# Patient Record
Sex: Female | Born: 1970 | Race: Black or African American | Hispanic: No | Marital: Single | Smoking: Never smoker
Health system: Southern US, Community
[De-identification: ages and names within clinical notes are randomized; demographics above are authoritative.]

## PROBLEM LIST (undated history)

## (undated) ENCOUNTER — Inpatient Hospital Stay (HOSPITAL_COMMUNITY): Payer: Self-pay

## (undated) DIAGNOSIS — F419 Anxiety disorder, unspecified: Secondary | ICD-10-CM

## (undated) DIAGNOSIS — O24419 Gestational diabetes mellitus in pregnancy, unspecified control: Secondary | ICD-10-CM

## (undated) DIAGNOSIS — R011 Cardiac murmur, unspecified: Secondary | ICD-10-CM

## (undated) HISTORY — PX: BREAST REDUCTION SURGERY: SHX8

## (undated) HISTORY — DX: Cardiac murmur, unspecified: R01.1

## (undated) HISTORY — DX: Anxiety disorder, unspecified: F41.9

---

## 1996-04-01 HISTORY — PX: SALPINGECTOMY: SHX328

## 1997-05-06 ENCOUNTER — Ambulatory Visit (HOSPITAL_COMMUNITY): Admission: RE | Admit: 1997-05-06 | Discharge: 1997-05-06 | Payer: Self-pay | Admitting: Family Medicine

## 1998-03-03 ENCOUNTER — Emergency Department (HOSPITAL_COMMUNITY): Admission: EM | Admit: 1998-03-03 | Discharge: 1998-03-03 | Payer: Self-pay | Admitting: Emergency Medicine

## 1998-03-04 ENCOUNTER — Encounter: Payer: Self-pay | Admitting: Emergency Medicine

## 1998-03-04 ENCOUNTER — Emergency Department (HOSPITAL_COMMUNITY): Admission: EM | Admit: 1998-03-04 | Discharge: 1998-03-04 | Payer: Self-pay | Admitting: Emergency Medicine

## 1998-10-06 ENCOUNTER — Inpatient Hospital Stay (HOSPITAL_COMMUNITY): Admission: AD | Admit: 1998-10-06 | Discharge: 1998-10-06 | Payer: Self-pay | Admitting: Obstetrics and Gynecology

## 1998-10-16 ENCOUNTER — Inpatient Hospital Stay (HOSPITAL_COMMUNITY): Admission: AD | Admit: 1998-10-16 | Discharge: 1998-10-19 | Payer: Self-pay | Admitting: *Deleted

## 1998-10-20 ENCOUNTER — Encounter (HOSPITAL_COMMUNITY): Admission: RE | Admit: 1998-10-20 | Discharge: 1999-01-18 | Payer: Self-pay | Admitting: *Deleted

## 1998-11-27 ENCOUNTER — Other Ambulatory Visit: Admission: RE | Admit: 1998-11-27 | Discharge: 1998-11-27 | Payer: Self-pay | Admitting: *Deleted

## 1999-01-21 ENCOUNTER — Encounter (HOSPITAL_COMMUNITY): Admission: RE | Admit: 1999-01-21 | Discharge: 1999-04-21 | Payer: Self-pay | Admitting: *Deleted

## 1999-04-24 ENCOUNTER — Encounter: Admission: RE | Admit: 1999-04-24 | Discharge: 1999-07-23 | Payer: Self-pay | Admitting: *Deleted

## 1999-08-22 ENCOUNTER — Encounter: Admission: RE | Admit: 1999-08-22 | Discharge: 1999-09-13 | Payer: Self-pay | Admitting: *Deleted

## 2000-01-04 ENCOUNTER — Other Ambulatory Visit: Admission: RE | Admit: 2000-01-04 | Discharge: 2000-01-04 | Payer: Self-pay | Admitting: Obstetrics and Gynecology

## 2000-01-24 ENCOUNTER — Inpatient Hospital Stay (HOSPITAL_COMMUNITY): Admission: AD | Admit: 2000-01-24 | Discharge: 2000-01-24 | Payer: Self-pay | Admitting: *Deleted

## 2000-06-04 ENCOUNTER — Inpatient Hospital Stay (HOSPITAL_COMMUNITY): Admission: AD | Admit: 2000-06-04 | Discharge: 2000-06-04 | Payer: Self-pay | Admitting: Obstetrics and Gynecology

## 2000-07-03 ENCOUNTER — Inpatient Hospital Stay (HOSPITAL_COMMUNITY): Admission: AD | Admit: 2000-07-03 | Discharge: 2000-07-05 | Payer: Self-pay | Admitting: Obstetrics and Gynecology

## 2001-10-01 ENCOUNTER — Encounter: Payer: Self-pay | Admitting: Obstetrics and Gynecology

## 2001-10-01 ENCOUNTER — Inpatient Hospital Stay (HOSPITAL_COMMUNITY): Admission: AD | Admit: 2001-10-01 | Discharge: 2001-10-01 | Payer: Self-pay | Admitting: Obstetrics and Gynecology

## 2001-10-08 ENCOUNTER — Encounter: Payer: Self-pay | Admitting: Obstetrics and Gynecology

## 2001-10-08 ENCOUNTER — Ambulatory Visit (HOSPITAL_COMMUNITY): Admission: RE | Admit: 2001-10-08 | Discharge: 2001-10-08 | Payer: Self-pay | Admitting: Obstetrics and Gynecology

## 2001-11-12 ENCOUNTER — Other Ambulatory Visit: Admission: RE | Admit: 2001-11-12 | Discharge: 2001-11-12 | Payer: Self-pay | Admitting: Obstetrics and Gynecology

## 2002-02-28 ENCOUNTER — Inpatient Hospital Stay (HOSPITAL_COMMUNITY): Admission: AD | Admit: 2002-02-28 | Discharge: 2002-02-28 | Payer: Self-pay | Admitting: Obstetrics and Gynecology

## 2002-04-30 ENCOUNTER — Inpatient Hospital Stay (HOSPITAL_COMMUNITY): Admission: AD | Admit: 2002-04-30 | Discharge: 2002-04-30 | Payer: Self-pay | Admitting: Obstetrics and Gynecology

## 2002-05-13 ENCOUNTER — Inpatient Hospital Stay (HOSPITAL_COMMUNITY): Admission: AD | Admit: 2002-05-13 | Discharge: 2002-05-13 | Payer: Self-pay | Admitting: Obstetrics and Gynecology

## 2002-05-15 ENCOUNTER — Inpatient Hospital Stay (HOSPITAL_COMMUNITY): Admission: AD | Admit: 2002-05-15 | Discharge: 2002-05-17 | Payer: Self-pay | Admitting: Obstetrics and Gynecology

## 2011-04-02 NOTE — L&D Delivery Note (Signed)
Delivery Note At 11:09 PM a viable and healthy female was delivered via Vaginal, Spontaneous Delivery (Presentation: Right Occiput Anterior).  APGAR: 8, 9; weight 7 lb 3 oz (3260 g).   Placenta status: Intact, Pathology Spontaneous.  Cord: 3 vessels with the following complications: Long Knot.  Cord pH: non  Anesthesia: Epidural  Episiotomy: None Lacerations: None Suture Repair: none Est. Blood Loss (mL): 250  Mom to postpartum.  Baby to nursery-stable.  Tiffany Maldonado A 11/25/2011, 11:54 PM

## 2011-07-10 ENCOUNTER — Encounter: Payer: 59 | Attending: Obstetrics and Gynecology | Admitting: *Deleted

## 2011-07-10 DIAGNOSIS — Z713 Dietary counseling and surveillance: Secondary | ICD-10-CM | POA: Insufficient documentation

## 2011-07-10 DIAGNOSIS — O99810 Abnormal glucose complicating pregnancy: Secondary | ICD-10-CM | POA: Insufficient documentation

## 2011-07-15 ENCOUNTER — Encounter: Payer: Self-pay | Admitting: *Deleted

## 2011-07-15 NOTE — Progress Notes (Signed)
  Patient was seen on 07/10/2011 for Gestational Diabetes self-management class at the [facility]. The following learning objectives were met by the patient during this course:   States the definition of Gestational Diabetes  States why dietary management is important in controlling blood glucose  Describes the effects each nutrient has on blood glucose levels  Demonstrates ability to create a balanced meal plan  Demonstrates carbohydrate counting   States when to check blood glucose levels  Demonstrates proper blood glucose monitoring techniques  States the effect of stress and exercise on blood glucose levels  States the importance of limiting caffeine and abstaining from alcohol and smoking  Blood glucose monitor given:  One Touch Ultra Mini Self Monitoring Kit Lot # [id number] x Exp: 01/2012 Blood glucose reading: 86 mg/dl  Patient instructed to monitor glucose levels: FBS: 60 - <90 2 hour: <120  *Patient received handouts:  Nutrition Diabetes and Pregnancy  Carbohydrate Counting List  Patient will be seen for follow-up as needed.

## 2011-11-09 ENCOUNTER — Encounter (HOSPITAL_COMMUNITY): Payer: Self-pay | Admitting: *Deleted

## 2011-11-09 ENCOUNTER — Inpatient Hospital Stay (HOSPITAL_COMMUNITY)
Admission: AD | Admit: 2011-11-09 | Discharge: 2011-11-09 | Disposition: A | Discharge status: Home / Self Care | Payer: 59 | Source: Ambulatory Visit | Attending: Obstetrics | Admitting: Obstetrics

## 2011-11-09 DIAGNOSIS — O99891 Other specified diseases and conditions complicating pregnancy: Secondary | ICD-10-CM | POA: Insufficient documentation

## 2011-11-09 DIAGNOSIS — N390 Urinary tract infection, site not specified: Secondary | ICD-10-CM | POA: Insufficient documentation

## 2011-11-09 DIAGNOSIS — O2390 Unspecified genitourinary tract infection in pregnancy, unspecified trimester: Secondary | ICD-10-CM | POA: Insufficient documentation

## 2011-11-09 DIAGNOSIS — O2340 Unspecified infection of urinary tract in pregnancy, unspecified trimester: Secondary | ICD-10-CM

## 2011-11-09 LAB — URINALYSIS, ROUTINE W REFLEX MICROSCOPIC
Bilirubin Urine: NEGATIVE
Glucose, UA: NEGATIVE mg/dL
Hgb urine dipstick: NEGATIVE
Ketones, ur: 40 mg/dL — AB
Nitrite: NEGATIVE
Protein, ur: NEGATIVE mg/dL
Specific Gravity, Urine: 1.025 (ref 1.005–1.030)
Urobilinogen, UA: 0.2 mg/dL (ref 0.0–1.0)
pH: 6.5 (ref 5.0–8.0)

## 2011-11-09 LAB — AMNISURE RUPTURE OF MEMBRANE (ROM) NOT AT ARMC: Amnisure ROM: NEGATIVE

## 2011-11-09 LAB — WET PREP, GENITAL: Trich, Wet Prep: NONE SEEN

## 2011-11-09 LAB — URINE MICROSCOPIC-ADD ON

## 2011-11-09 MED ORDER — CEPHALEXIN 500 MG PO CAPS: 500.0000 mg | ORAL_CAPSULE | Freq: Two times a day (BID) | ORAL | Status: AC

## 2011-11-09 NOTE — MAU Provider Note (Signed)
History   Tiffany Maldonado is a 41 y.o. [id number] at [redacted]w[redacted]d, c/o increased "wetness" today and pelvic pressure. Patient OOW on mod. bedrest for preterm cervical change, cvx was 3/60/-2 this past week in office. Reports good FM, mild occasional contractions. No fever / chills, no N/V. PNC at [facility] w/ Dr. Cherly Hensen.  GDM A1 Hx GBS + bacteriuria tx w/ Amoxicillin early 1st trim.   Chief Complaint  Patient presents with  . Pelvic Pain     OB History    Grav Para Term Preterm Abortions TAB SAB Ect Mult Living   7 4 4  2 1  1  4       History reviewed. No pertinent past medical history.  Past Surgical History  Procedure Date  . Cesarean section   . Breast reduction surgery   . Salpingectomy 1998    Family History  Problem Relation Age of Onset  . Hypertension Mother   . Diabetes Father   . Diabetes Sister   . Cancer Maternal Aunt   . Cancer Maternal Uncle     History  Substance Use Topics  . Smoking status: Never Smoker   . Smokeless tobacco: Not on file  . Alcohol Use: No    Allergies:  Allergies  Allergen Reactions  . Latex Itching    Prescriptions prior to admission  Medication Sig Dispense Refill  . ciclopirox (PENLAC) 8 % solution Apply 1 application topically once a week. Apply over nail and surrounding skin. Apply daily over previous coat. After seven (7) days, may remove with alcohol and continue cycle.      . Prenatal Vit-Fe Fumarate-FA (PRENATAL MULTIVITAMIN) TABS Take 1 tablet by mouth at bedtime.       ROS neg except as noted Physical Exam     Physical Exam: Blood pressure 113/64, pulse 104, temperature 98.2 F (36.8 C), temperature source Oral, resp. rate 18, height 4\' 11"  (1.499 m), weight 78.291 kg (172 lb 9.6 oz), unknown if currently breastfeeding. General: NAD Heart: RRR, no murmurs Lungs: CTA b/l  Abd: Soft, NT, EFW 6.5-7  Ext: no edema Neuro: DTRs normal Pelvic: SSE no fluid in vaginal vault, amnisure neg, wet prep grossly nl cvx 4/70/-2  posterior vertex  EFM FHR 140, moderate variability, + accel's, no decel's Toco: occasional ctx, patient unaware  ED Course   Results for orders placed during the hospital encounter of 11/09/11 (from the past 24 hour(s))  URINALYSIS, ROUTINE W REFLEX MICROSCOPIC     Status: Abnormal   Collection Time   11/09/11  1:32 PM      Component Value Range   Color, Urine YELLOW  YELLOW   APPearance HAZY (*) CLEAR   Specific Gravity, Urine 1.025  1.005 - 1.030   pH 6.5  5.0 - 8.0   Glucose, UA NEGATIVE  NEGATIVE mg/dL   Hgb urine dipstick NEGATIVE  NEGATIVE   Bilirubin Urine NEGATIVE  NEGATIVE   Ketones, ur 40 (*) NEGATIVE mg/dL   Protein, ur NEGATIVE  NEGATIVE mg/dL   Urobilinogen, UA 0.2  0.0 - 1.0 mg/dL   Nitrite NEGATIVE  NEGATIVE   Leukocytes, UA SMALL (*) NEGATIVE  URINE MICROSCOPIC-ADD ON     Status: Abnormal   Collection Time   11/09/11  1:32 PM      Component Value Range   Squamous Epithelial / LPF FEW (*) RARE   WBC, UA 7-10  <3 WBC/hpf   RBC / HPF 0-2  <3 RBC/hpf   Bacteria, UA MANY (*) RARE  AMNISURE RUPTURE OF MEMBRANE (ROM)     Status: Normal   Collection Time   11/09/11  2:30 PM      Component Value Range   Amnisure ROM NEGATIVE    WET PREP, GENITAL     Status: Abnormal   Collection Time   11/09/11  3:17 PM      Component Value Range   Yeast Wet Prep HPF POC NONE SEEN  NONE SEEN   Trich, Wet Prep NONE SEEN  NONE SEEN   Clue Cells Wet Prep HPF POC NONE SEEN  NONE SEEN   WBC, Wet Prep HPF POC FEW (*) NONE SEEN    IUP at [redacted]w[redacted]d  Slow progressive cervical change,  No active labor No evidence of PROM Suspect UTI per UA and clinical presentation Will start presumptive ABX course pending UCX - Keflex 500 mg PO BID x 7 days D/C home w/ PTL precautions, continue modified bed rest. Push PO hydration F/U w/ Dr. Cherly Hensen as scheduled next week  [facility] 11/09/2011 4:23 PM

## 2011-11-09 NOTE — MAU Note (Signed)
Pt reports increased pelvic pressure and discomfort. Stated she was already 3cm dilated in office this week. Denies Vag bleeding or discharge.

## 2011-11-09 NOTE — MAU Note (Signed)
"  I started having pelvic pressure last night.  I have been on bedrest since last Wednesday, because I was dilated to 3cm.  No bleeding.  I have been a little wet down there.  I'm not sure if it's because he is right there making my bladder leak or my water is broken.  When I walk it just feels like he is just right there.  (+) FM."

## 2011-11-10 LAB — URINE CULTURE: Colony Count: >=100000

## 2011-11-12 ENCOUNTER — Inpatient Hospital Stay (HOSPITAL_COMMUNITY)
Admission: AD | Admit: 2011-11-12 | Discharge: 2011-11-12 | Disposition: A | Discharge status: Home / Self Care | Payer: 59 | Source: Ambulatory Visit | Attending: Obstetrics and Gynecology | Admitting: Obstetrics and Gynecology

## 2011-11-12 ENCOUNTER — Encounter (HOSPITAL_COMMUNITY): Payer: Self-pay | Admitting: *Deleted

## 2011-11-12 DIAGNOSIS — O4700 False labor before 37 completed weeks of gestation, unspecified trimester: Secondary | ICD-10-CM | POA: Insufficient documentation

## 2011-11-12 HISTORY — DX: Gestational diabetes mellitus in pregnancy, unspecified control: O24.419

## 2011-11-12 NOTE — MAU Note (Signed)
Pt reports contractions, lower back pain x 24 hours.

## 2011-11-20 ENCOUNTER — Encounter (HOSPITAL_COMMUNITY): Payer: Self-pay | Admitting: *Deleted

## 2011-11-20 ENCOUNTER — Inpatient Hospital Stay (HOSPITAL_COMMUNITY)
Admission: AD | Admit: 2011-11-20 | Discharge: 2011-11-21 | Disposition: A | Discharge status: Home / Self Care | Payer: 59 | Source: Ambulatory Visit | Attending: Obstetrics and Gynecology | Admitting: Obstetrics and Gynecology

## 2011-11-20 DIAGNOSIS — O479 False labor, unspecified: Secondary | ICD-10-CM | POA: Insufficient documentation

## 2011-11-20 DIAGNOSIS — O99810 Abnormal glucose complicating pregnancy: Secondary | ICD-10-CM | POA: Insufficient documentation

## 2011-11-20 NOTE — MAU Note (Signed)
Pt G7 P4 at 37.1wks feeling increased pressure tonight.  Denies leaking.  Gest Diabetic-diet controlled.

## 2011-11-20 NOTE — MAU Note (Signed)
Pt states she started feeling pressure on and off for the past week on and off. Pt states she has not been feeling that many contractions

## 2011-11-21 ENCOUNTER — Inpatient Hospital Stay (HOSPITAL_COMMUNITY): Payer: 59

## 2011-11-21 NOTE — MAU Note (Signed)
Dr. Cherly Hensen called and informed that EFM tracing reassuring and not reactive. BPP ordered

## 2011-11-25 ENCOUNTER — Encounter (HOSPITAL_COMMUNITY): Payer: Self-pay | Admitting: Anesthesiology

## 2011-11-25 ENCOUNTER — Encounter (HOSPITAL_COMMUNITY): Payer: Self-pay | Admitting: *Deleted

## 2011-11-25 ENCOUNTER — Inpatient Hospital Stay (HOSPITAL_COMMUNITY): Payer: 59 | Admitting: Anesthesiology

## 2011-11-25 ENCOUNTER — Inpatient Hospital Stay (HOSPITAL_COMMUNITY)
Admission: AD | Admit: 2011-11-25 | Discharge: 2011-11-27 | DRG: 775 | Disposition: A | Discharge status: Home / Self Care | Payer: 59 | Source: Ambulatory Visit | Attending: Obstetrics and Gynecology | Admitting: Obstetrics and Gynecology

## 2011-11-25 DIAGNOSIS — O34219 Maternal care for unspecified type scar from previous cesarean delivery: Secondary | ICD-10-CM | POA: Diagnosis present

## 2011-11-25 DIAGNOSIS — Z22330 Carrier of Group B streptococcus: Secondary | ICD-10-CM

## 2011-11-25 DIAGNOSIS — O99814 Abnormal glucose complicating childbirth: Principal | ICD-10-CM | POA: Diagnosis present

## 2011-11-25 DIAGNOSIS — O99892 Other specified diseases and conditions complicating childbirth: Secondary | ICD-10-CM | POA: Diagnosis present

## 2011-11-25 LAB — GLUCOSE, CAPILLARY
Glucose-Capillary: 54 mg/dL — ABNORMAL LOW (ref 70–99)
Glucose-Capillary: 84 mg/dL (ref 70–99)

## 2011-11-25 LAB — PREPARE RBC (CROSSMATCH)

## 2011-11-25 LAB — CBC
HCT: 35.3 % — ABNORMAL LOW (ref 36.0–46.0)
Hemoglobin: 12.1 g/dL (ref 12.0–15.0)
MCH: 30.6 pg (ref 26.0–34.0)
MCV: 89.1 fL (ref 78.0–100.0)
RBC: 3.96 MIL/uL (ref 3.87–5.11)

## 2011-11-25 LAB — OB RESULTS CONSOLE RPR: RPR: NONREACTIVE

## 2011-11-25 LAB — OB RESULTS CONSOLE HEPATITIS B SURFACE ANTIGEN: Hepatitis B Surface Ag: NEGATIVE

## 2011-11-25 LAB — OB RESULTS CONSOLE GC/CHLAMYDIA
Chlamydia: NEGATIVE
Gonorrhea: NEGATIVE

## 2011-11-25 LAB — OB RESULTS CONSOLE RUBELLA ANTIBODY, IGM: Rubella: IMMUNE

## 2011-11-25 LAB — ABO/RH: ABO/RH(D): B POS

## 2011-11-25 MED ORDER — LACTATED RINGERS IV SOLN
INTRAVENOUS | Status: DC
  Administered 2011-11-25 (×2): via INTRAVENOUS

## 2011-11-25 MED ORDER — LACTATED RINGERS IV SOLN: 500.0000 mL | Freq: Once | INTRAVENOUS | Status: DC

## 2011-11-25 MED ORDER — PENICILLIN G POTASSIUM 5000000 UNITS IJ SOLR
5.0000 10*6.[IU] | Freq: Once | INTRAVENOUS | Status: AC
  Administered 2011-11-25: 5 10*6.[IU] via INTRAVENOUS
  Filled 2011-11-25: qty 5

## 2011-11-25 MED ORDER — LACTATED RINGERS IV SOLN
500.0000 mL | INTRAVENOUS | Status: DC | PRN
  Administered 2011-11-25 (×2): 500 mL via INTRAVENOUS

## 2011-11-25 MED ORDER — LIDOCAINE HCL (PF) 1 % IJ SOLN: 30.0000 mL | INTRAMUSCULAR | Status: DC | PRN

## 2011-11-25 MED ORDER — SODIUM BICARBONATE 8.4 % IV SOLN
INTRAVENOUS | Status: DC | PRN
  Administered 2011-11-25: 4 mL via EPIDURAL

## 2011-11-25 MED ORDER — ACETAMINOPHEN 325 MG PO TABS: 650.0000 mg | ORAL_TABLET | ORAL | Status: DC | PRN

## 2011-11-25 MED ORDER — OXYTOCIN 10 UNIT/ML IJ SOLN: 10.0000 [IU] | Freq: Once | INTRAMUSCULAR | Status: DC

## 2011-11-25 MED ORDER — DIPHENHYDRAMINE HCL 50 MG/ML IJ SOLN: 12.5000 mg | INTRAMUSCULAR | Status: DC | PRN

## 2011-11-25 MED ORDER — ONDANSETRON HCL 4 MG/2ML IJ SOLN
4.0000 mg | Freq: Four times a day (QID) | INTRAMUSCULAR | Status: DC | PRN
  Administered 2011-11-26: 4 mg via INTRAVENOUS
  Filled 2011-11-25: qty 2

## 2011-11-25 MED ORDER — OXYTOCIN 40 UNITS IN LACTATED RINGERS INFUSION - SIMPLE MED
62.5000 mL/h | Freq: Once | INTRAVENOUS | Status: DC
  Filled 2011-11-25: qty 1000

## 2011-11-25 MED ORDER — PHENYLEPHRINE 40 MCG/ML (10ML) SYRINGE FOR IV PUSH (FOR BLOOD PRESSURE SUPPORT)
80.0000 ug | PREFILLED_SYRINGE | INTRAVENOUS | Status: AC | PRN
  Administered 2011-11-25 (×2): 80 ug via INTRAVENOUS
  Administered 2011-11-25: 120 ug via INTRAVENOUS
  Filled 2011-11-25 (×2): qty 5

## 2011-11-25 MED ORDER — FENTANYL 2.5 MCG/ML BUPIVACAINE 1/10 % EPIDURAL INFUSION (WH - ANES)
14.0000 mL/h | INTRAMUSCULAR | Status: DC
  Administered 2011-11-25: 10 mL/h via EPIDURAL
  Filled 2011-11-25 (×2): qty 60

## 2011-11-25 MED ORDER — OXYCODONE-ACETAMINOPHEN 5-325 MG PO TABS
1.0000 | ORAL_TABLET | ORAL | Status: DC | PRN
Start: 2011-11-25 — End: 2011-11-26

## 2011-11-25 MED ORDER — PENICILLIN G POTASSIUM 5000000 UNITS IJ SOLR
2.5000 10*6.[IU] | INTRAVENOUS | Status: DC
  Administered 2011-11-25 (×2): 2.5 10*6.[IU] via INTRAVENOUS
  Filled 2011-11-25 (×6): qty 2.5

## 2011-11-25 MED ORDER — EPHEDRINE 5 MG/ML INJ
10.0000 mg | INTRAVENOUS | Status: DC | PRN
  Filled 2011-11-25: qty 4

## 2011-11-25 MED ORDER — FENTANYL 2.5 MCG/ML BUPIVACAINE 1/10 % EPIDURAL INFUSION (WH - ANES)
INTRAMUSCULAR | Status: DC | PRN
  Administered 2011-11-25: 14 mL/h via EPIDURAL

## 2011-11-25 MED ORDER — OXYTOCIN BOLUS FROM INFUSION
250.0000 mL | Freq: Once | INTRAVENOUS | Status: DC
  Filled 2011-11-25: qty 500

## 2011-11-25 MED ORDER — IBUPROFEN 600 MG PO TABS
600.0000 mg | ORAL_TABLET | Freq: Four times a day (QID) | ORAL | Status: DC | PRN
  Filled 2011-11-25: qty 1

## 2011-11-25 MED ORDER — CITRIC ACID-SODIUM CITRATE 334-500 MG/5ML PO SOLN
30.0000 mL | ORAL | Status: DC | PRN
  Administered 2011-11-26: 30 mL via ORAL
  Filled 2011-11-25: qty 15

## 2011-11-25 MED ORDER — PHENYLEPHRINE 40 MCG/ML (10ML) SYRINGE FOR IV PUSH (FOR BLOOD PRESSURE SUPPORT)
80.0000 ug | PREFILLED_SYRINGE | INTRAVENOUS | Status: DC | PRN
  Filled 2011-11-25: qty 5

## 2011-11-25 MED ORDER — METHYLERGONOVINE MALEATE 0.2 MG/ML IJ SOLN
INTRAMUSCULAR | Status: AC
  Filled 2011-11-25: qty 1

## 2011-11-25 MED ORDER — OXYTOCIN 40 UNITS IN LACTATED RINGERS INFUSION - SIMPLE MED
1.0000 m[IU]/min | INTRAVENOUS | Status: DC
  Administered 2011-11-25: 4 m[IU]/min via INTRAVENOUS
  Administered 2011-11-25: 2 m[IU]/min via INTRAVENOUS

## 2011-11-25 MED ORDER — OXYTOCIN 40 UNITS IN LACTATED RINGERS INFUSION - SIMPLE MED: 1.0000 m[IU]/min | INTRAVENOUS | Status: DC

## 2011-11-25 MED ORDER — EPHEDRINE 5 MG/ML INJ
10.0000 mg | INTRAVENOUS | Status: AC | PRN
  Administered 2011-11-25 (×2): 10 mg via INTRAVENOUS
  Filled 2011-11-25 (×2): qty 4

## 2011-11-25 MED ORDER — LACTATED RINGERS IV SOLN
INTRAVENOUS | Status: DC
  Administered 2011-11-25 (×2): via INTRAUTERINE

## 2011-11-25 NOTE — Anesthesia Preprocedure Evaluation (Signed)
Anesthesia Evaluation  Patient identified by MRN, date of birth, ID band Patient awake    Reviewed: Allergy & Precautions, H&P , Patient's Chart, lab work & pertinent test results  Airway Mallampati: II TM Distance: >3 FB Neck ROM: full    Dental  (+) Teeth Intact   Pulmonary  breath sounds clear to auscultation        Cardiovascular Rhythm:regular Rate:Normal     Neuro/Psych    GI/Hepatic   Endo/Other  GestationalMorbid obesity  Renal/GU      Musculoskeletal   Abdominal   Peds  Hematology   Anesthesia Other Findings       Reproductive/Obstetrics (+) Pregnancy                           Anesthesia Physical Anesthesia Plan  ASA: III  Anesthesia Plan: Epidural   Post-op Pain Management:    Induction:   Airway Management Planned:   Additional Equipment:   Intra-op Plan:   Post-operative Plan:   Informed Consent: I have reviewed the patients History and Physical, chart, labs and discussed the procedure including the risks, benefits and alternatives for the proposed anesthesia with the patient or authorized representative who has indicated his/her understanding and acceptance.   Dental Advisory Given  Plan Discussed with:   Anesthesia Plan Comments: (Labs checked- platelets confirmed with RN in room. Fetal heart tracing, per RN, reported to be stable enough for sitting procedure. Discussed epidural, and patient consents to the procedure:  included risk of possible headache,backache, failed block, allergic reaction, and nerve injury. This patient was asked if she had any questions or concerns before the procedure started. )        Anesthesia Quick Evaluation  

## 2011-11-25 NOTE — H&P (Signed)
Tiffany Maldonado is a 41 y.o. female presenting for admission 2nd to [facility] today @ 12:30 Pm meconium fluid. PNC complicated by Class A1 GDM, previous C/S w/ successful VBAC x 3  Maternal Medical History:  Reason for admission: Reason for admission: rupture of membranes.  Contractions: Frequency: irregular.    Fetal activity: Perceived fetal activity is normal.    Prenatal Complications - Diabetes: gestational. Diabetes is managed by diet.      OB History    Grav Para Term Preterm Abortions TAB SAB Ect Mult Living   7 4 4  2 1  1  4      Past Medical History  Diagnosis Date  . Gestational diabetes    Past Surgical History  Procedure Date  . Cesarean section   . Breast reduction surgery   . Salpingectomy 1998   Family History: family history includes Cancer in her maternal aunt and maternal uncle; Diabetes in her father and sister; and Hypertension in her mother. Social History:  reports that she has never smoked. She does not have any smokeless tobacco history on file. She reports that she does not drink alcohol or use illicit drugs.   Prenatal Transfer Tool  Maternal Diabetes: Yes:  Diabetes Type:  Diet controlled Genetic Screening: Normal Maternal Ultrasounds/Referrals: Normal Fetal Ultrasounds or other Referrals:  None Maternal Substance Abuse:  No Significant Maternal Medications:  None Significant Maternal Lab Results:  Lab values include: Group B Strep positive Other Comments:  None  ROS neg    Blood pressure 116/64, pulse 116, temperature 98.6 F (37 C), temperature source Oral, resp. rate 20, unknown if currently breastfeeding. Maternal Exam:  Uterine Assessment: Contraction strength is mild.  Contraction frequency is irregular.   Abdomen: Patient reports no abdominal tenderness. Surgical scars: low transverse.   Fetal presentation: vertex  Introitus: Amniotic fluid character: meconium stained.  Pelvis: adequate for delivery.   Cervix: Cervix evaluated by  digital exam.     Physical Exam  Constitutional: She is oriented to person, place, and time. She appears well-developed and well-nourished.  HENT:  Head: Normocephalic.  Cardiovascular: Regular rhythm.   Respiratory: Breath sounds normal.  GI: Soft.  Musculoskeletal: Normal range of motion.  Neurological: She is alert and oriented to person, place, and time.  Skin: Skin is warm and dry.  Psychiatric: She has a normal mood and affect.   VE: 4/70/-2 Meconium w/ particulate material IUPC placed  Prenatal labs: ABO, Rh:  B positive Antibody:  neg Rubella:  Immune RPR:   NR HBsAg:   neg HIV:   neg GBS:   positive  Assessment/Plan: [facility]  Gestational diabetes Class A1 Latent phase GBS cx positive. Multiparous Previous C/S w/ prior successful vaginal delivery P) admit. BS q 4 hrs. Amnioinfusion. Start PCN prophylaxis, Pitocin augmentation. Routine labs   Tiffany Maldonado A 11/25/2011, 1:53 PM

## 2011-11-25 NOTE — Progress Notes (Signed)
S: c/o hunger Epidural..less dense as she can move legs  O: VE: 6cm/edematous/-2  Tracing: baseline 140 reassuring since epidural dose decreased  BS 80's  IMP: [facility].Marland Kitchenmec fluid with amnioinfusion Protracted active phase due to inadequate labor Class A1 GDM P) exaggerated sim position. Cont increase pitocin. Cont BS testing

## 2011-11-25 NOTE — Anesthesia Procedure Notes (Signed)

## 2011-11-25 NOTE — Progress Notes (Signed)
S: Epidural Pt notes does not feel legs  O: Tracing reviewed. FHR decel down to 70's post contractions due to low BP( 77/39).  VE unchanged. ISE placed. Pitocin d/c'ed Ephedrine 10mg  IV request. IVF bolus. Maternal O2. Positional changes done Dr Rodman Pickle called as above measures not effective  IMP: Fetal heart decel due to hypotension related to Epidural P) await anesthesia input. OR placed on standby

## 2011-11-25 NOTE — MAU Note (Addendum)
Water broke about 1230, started out clear- now greenish. No bleeding.  5th baby. Having some contractions.  GBS pos. Gest diabetic diet controlled.  Was 4 cm when last checked.

## 2011-11-26 ENCOUNTER — Encounter (HOSPITAL_COMMUNITY): Payer: Self-pay | Admitting: *Deleted

## 2011-11-26 LAB — CBC
Hemoglobin: 12.7 g/dL (ref 12.0–15.0)
MCH: 30.7 pg (ref 26.0–34.0)
MCHC: 34.4 g/dL (ref 30.0–36.0)
Platelets: 369 10*3/uL (ref 150–400)
RBC: 4.14 MIL/uL (ref 3.87–5.11)

## 2011-11-26 MED ORDER — OXYCODONE-ACETAMINOPHEN 5-325 MG PO TABS
1.0000 | ORAL_TABLET | ORAL | Status: DC | PRN
  Administered 2011-11-26 – 2011-11-27 (×4): 1 via ORAL
  Filled 2011-11-26 (×4): qty 1

## 2011-11-26 MED ORDER — ONDANSETRON HCL 4 MG/2ML IJ SOLN: 4.0000 mg | INTRAMUSCULAR | Status: DC | PRN

## 2011-11-26 MED ORDER — SIMETHICONE 80 MG PO CHEW: 80.0000 mg | CHEWABLE_TABLET | ORAL | Status: DC | PRN

## 2011-11-26 MED ORDER — PRENATAL MULTIVITAMIN CH
1.0000 | ORAL_TABLET | Freq: Every day | ORAL | Status: DC
  Administered 2011-11-26: 1 via ORAL
  Filled 2011-11-26: qty 1

## 2011-11-26 MED ORDER — DIBUCAINE 1 % RE OINT: 1.0000 "application " | TOPICAL_OINTMENT | RECTAL | Status: DC | PRN

## 2011-11-26 MED ORDER — WITCH HAZEL-GLYCERIN EX PADS: 1.0000 "application " | MEDICATED_PAD | CUTANEOUS | Status: DC | PRN

## 2011-11-26 MED ORDER — CEPHALEXIN 500 MG PO CAPS
500.0000 mg | ORAL_CAPSULE | Freq: Four times a day (QID) | ORAL | Status: DC
  Filled 2011-11-26 (×2): qty 1

## 2011-11-26 MED ORDER — FERROUS SULFATE 325 (65 FE) MG PO TABS
325.0000 mg | ORAL_TABLET | Freq: Two times a day (BID) | ORAL | Status: DC
  Administered 2011-11-26 (×2): 325 mg via ORAL
  Filled 2011-11-26 (×3): qty 1

## 2011-11-26 MED ORDER — ZOLPIDEM TARTRATE 5 MG PO TABS: 5.0000 mg | ORAL_TABLET | Freq: Every evening | ORAL | Status: DC | PRN

## 2011-11-26 MED ORDER — TETANUS-DIPHTH-ACELL PERTUSSIS 5-2.5-18.5 LF-MCG/0.5 IM SUSP
0.5000 mL | Freq: Once | INTRAMUSCULAR | Status: AC
  Administered 2011-11-26: 0.5 mL via INTRAMUSCULAR
  Filled 2011-11-26 (×2): qty 0.5

## 2011-11-26 MED ORDER — ONDANSETRON HCL 4 MG PO TABS: 4.0000 mg | ORAL_TABLET | ORAL | Status: DC | PRN

## 2011-11-26 MED ORDER — SENNOSIDES-DOCUSATE SODIUM 8.6-50 MG PO TABS
2.0000 | ORAL_TABLET | Freq: Every day | ORAL | Status: DC
  Administered 2011-11-26: 2 via ORAL

## 2011-11-26 MED ORDER — LANOLIN HYDROUS EX OINT: TOPICAL_OINTMENT | CUTANEOUS | Status: DC | PRN

## 2011-11-26 MED ORDER — DIPHENHYDRAMINE HCL 25 MG PO CAPS: 25.0000 mg | ORAL_CAPSULE | Freq: Four times a day (QID) | ORAL | Status: DC | PRN

## 2011-11-26 MED ORDER — METHYLERGONOVINE MALEATE 0.2 MG/ML IJ SOLN
0.2000 mg | Freq: Once | INTRAMUSCULAR | Status: AC
  Administered 2011-11-25: 0.2 mg via INTRAMUSCULAR

## 2011-11-26 MED ORDER — IBUPROFEN 600 MG PO TABS
600.0000 mg | ORAL_TABLET | Freq: Four times a day (QID) | ORAL | Status: DC
  Administered 2011-11-26 – 2011-11-27 (×7): 600 mg via ORAL
  Filled 2011-11-26 (×7): qty 1

## 2011-11-26 MED ORDER — BENZOCAINE-MENTHOL 20-0.5 % EX AERO: 1.0000 "application " | INHALATION_SPRAY | CUTANEOUS | Status: DC | PRN

## 2011-11-26 NOTE — Progress Notes (Signed)
Called MD, S. Cousins to verify that 11/26/11 order for 1 dose of Methergine at 2315 should be canceled because dose already given 11/25/11.

## 2011-11-26 NOTE — Plan of Care (Signed)
Problem: Phase II Progression Outcomes Goal: Initiate breastfeeding within 1hr delivery Outcome: Not Met (add Reason) Due to patient nausea and vomiting

## 2011-11-26 NOTE — Anesthesia Postprocedure Evaluation (Signed)
  Anesthesia Post-op Note  Patient: Tiffany Maldonado  Procedure(s) Performed: * No procedures listed *  Patient Location: PACU and Mother/Baby  Anesthesia Type: Epidural  Level of Consciousness: awake, alert  and oriented  Airway and Oxygen Therapy: Patient Spontanous Breathing  Post-op Pain: mild  Post-op Assessment: Patient's Cardiovascular Status Stable, Respiratory Function Stable, No signs of Nausea or vomiting, Adequate PO intake and Pain level controlled  Post-op Vital Signs: stable  Complications: No apparent anesthesia complications

## 2011-11-26 NOTE — Progress Notes (Signed)
PPD 1 SVD  S:  Reports feeling well             Tolerating po/ No nausea or vomiting             Bleeding is light             Pain controlled withnone and  motrin and percocet             Up ad lib / ambulatory  Newborn breast feeding  / Circumcision planned   O:  A & O x 3 NAD             VS: Blood pressure 97/64, pulse 69, temperature 98.1 F (36.7 C), temperature source Oral, resp. rate 20, SpO2 100.00%, unknown if currently breastfeeding.  LABS: WBC/Hgb/Hct/Plts:  19.9/12.7/36.9/369 (08/27 0540)   Lungs: Clear and unlabored  Heart: regular rate and rhythm  Abdomen: soft, non-tender, non-distended              Fundus: firm, non-tender, Ueven  Perineum: mild edema  Lochia: light  Extremities: trace edema, no calf pain or tenderness    A: PPD # 1   Doing well - stable status  P:  Routine post partum orders    Marlinda Mike CNM, MSN 11/26/2011, 9:52 AM

## 2011-11-27 MED ORDER — IBUPROFEN 600 MG PO TABS: 600.0000 mg | ORAL_TABLET | Freq: Four times a day (QID) | ORAL | Status: AC

## 2011-11-27 MED ORDER — OXYCODONE-ACETAMINOPHEN 5-325 MG PO TABS: 1.0000 | ORAL_TABLET | ORAL | Status: AC | PRN

## 2011-11-27 NOTE — Discharge Summary (Signed)
Obstetric Discharge Summary Reason for Admission: onset of labor and rupture of membranes, Class A1 GDM Prenatal Procedures: ultrasound Intrapartum Procedures: spontaneous vaginal delivery, GBS prophylaxis and epidural Postpartum Procedures: TDaP vaccine Complications-Operative and Postpartum: none Hemoglobin  Date Value Range Status  11/26/2011 12.7  12.0 - 15.0 g/dL Final     HCT  Date Value Range Status  11/26/2011 36.9  36.0 - 46.0 % Final    Physical Exam:  General: alert, cooperative and no distress Lochia: appropriate Uterine Fundus: firm Incision: n/a DVT Evaluation: Negative Homan's sign. No significant calf/ankle edema.  Discharge Diagnoses: Term Pregnancy-delivered and VBAC x 4 Class A1 GDM  Discharge Information: Date: 11/27/2011 Activity: pelvic rest x 6 wk Diet: routine Medications: PNV, Ibuprofen and Percocet Condition: stable Instructions: refer to practice specific booklet Discharge to: home Follow-up Information    Follow up with Bonnie Roig A, MD. Schedule an appointment as soon as possible for a visit in 6 weeks.   Contact information:   [address] [place] [address] [phone]          Newborn Data: Live born female  Birth Weight: 7 lb 3 oz (3260 g) APGAR: 8, 9  Home with mother.  PAUL,DANIELA 11/27/2011, 10:00 AM

## 2011-11-27 NOTE — Progress Notes (Signed)
Post Partum Day #2            Information for the patient's newborn:  Samanvi, Cuccia [[id number]]  female   / circumcision done Feeding: breast - plans pumping and feeding  Subjective: No HA, SOB, CP, F/C, breast symptoms. Pain - cramping on Motrin, took Percocet earlier, now very sleepy. Normal vaginal bleeding, no clots.      Objective:  Temp:  [97.8 F (36.6 C)-98.6 F (37 C)] 98.2 F (36.8 C) (08/28 0602) Pulse Rate:  [74-91] 77  (08/28 0823) Resp:  [16-18] 18  (08/28 0823) BP: (101-111)/(66-70) 104/70 mmHg (08/28 0823) SpO2:  [95 %] 95 % (08/28 0111)  No intake or output data in the 24 hours ending 11/27/11 0954     Basename 11/26/11 0540 11/25/11 1353  WBC 19.9* 9.8  HGB 12.7 12.1  HCT 36.9 35.3*  PLT 369 360    Blood type: B/Positive/-- (08/26 1410) Rubella: Immune (08/26 1410)    Physical Exam:  General: cooperative, no distress and sleepy Uterine Fundus: firm Lochia: appropriate Perineum: perineum intect, edema none DVT Evaluation: Negative Homan's sign. No significant calf/ankle edema.    Assessment/Plan: PPD # 2 / 41 y.o., [id number] S/P: VBAC x4   Active Problems:  Postpartum care following vaginal delivery (8/26)    normal postpartum exam  Continue current postpartum care  D/C home   LOS: 2 days   PAUL,DANIELA, CNM, MSN 11/27/2011, 9:54 AM

## 2011-11-28 LAB — TYPE AND SCREEN

## 2013-01-12 ENCOUNTER — Other Ambulatory Visit: Payer: Self-pay | Admitting: Internal Medicine

## 2013-01-12 ENCOUNTER — Other Ambulatory Visit: Payer: Self-pay

## 2013-01-12 DIAGNOSIS — Z1231 Encounter for screening mammogram for malignant neoplasm of breast: Secondary | ICD-10-CM

## 2013-02-10 ENCOUNTER — Ambulatory Visit
Admission: RE | Admit: 2013-02-10 | Discharge: 2013-02-10 | Disposition: A | Discharge status: Home / Self Care | Payer: 59 | Source: Ambulatory Visit

## 2013-02-10 DIAGNOSIS — Z1231 Encounter for screening mammogram for malignant neoplasm of breast: Secondary | ICD-10-CM

## 2013-02-15 ENCOUNTER — Other Ambulatory Visit: Payer: Self-pay | Admitting: Internal Medicine

## 2013-02-15 DIAGNOSIS — R928 Other abnormal and inconclusive findings on diagnostic imaging of breast: Secondary | ICD-10-CM

## 2013-03-03 ENCOUNTER — Ambulatory Visit
Admission: RE | Admit: 2013-03-03 | Discharge: 2013-03-03 | Disposition: A | Discharge status: Home / Self Care | Payer: 59 | Source: Ambulatory Visit | Attending: Internal Medicine | Admitting: Internal Medicine

## 2013-03-03 DIAGNOSIS — R928 Other abnormal and inconclusive findings on diagnostic imaging of breast: Secondary | ICD-10-CM

## 2013-04-14 IMAGING — US US FETAL BPP W/O NONSTRESS
1 series · 8 of 8 positions shown · non-contrast
Comparison: None.

CLINICAL DATA: Non reactive nonstress test.

ULTRAOUND FETAL BPP W/O NONSTRESS

[Series 1: us fetal bpp w/o nonstress · non-contrast · 8 acquisitions, 8 frames shown]
[im 1/8]
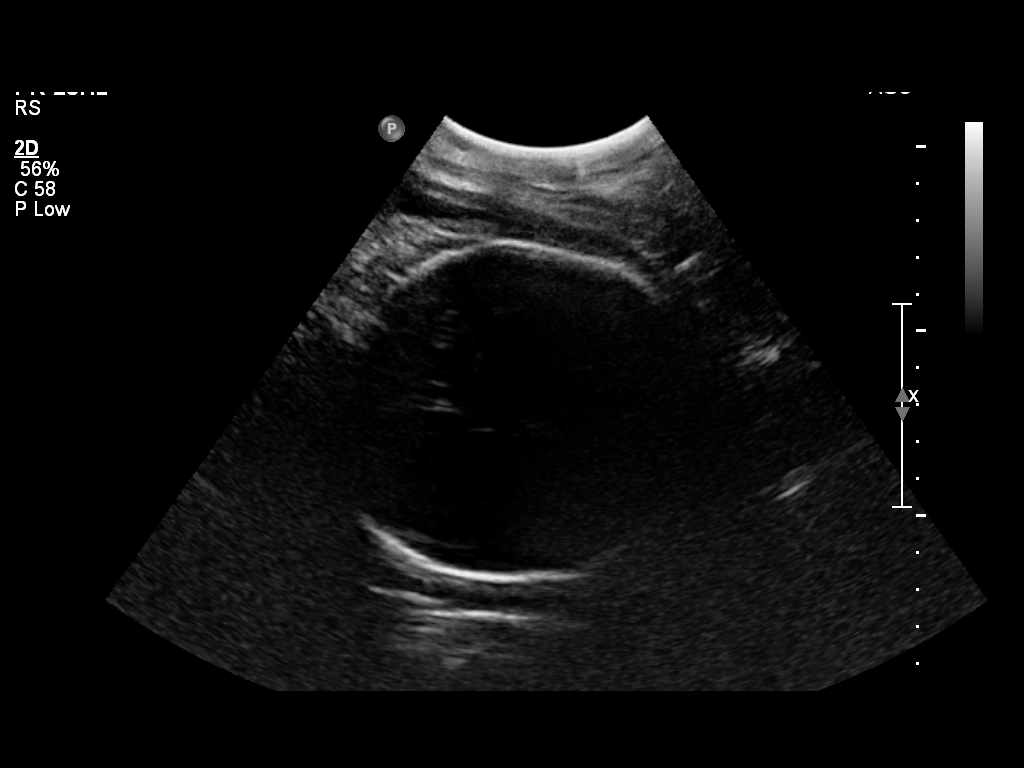
[im 2/8]
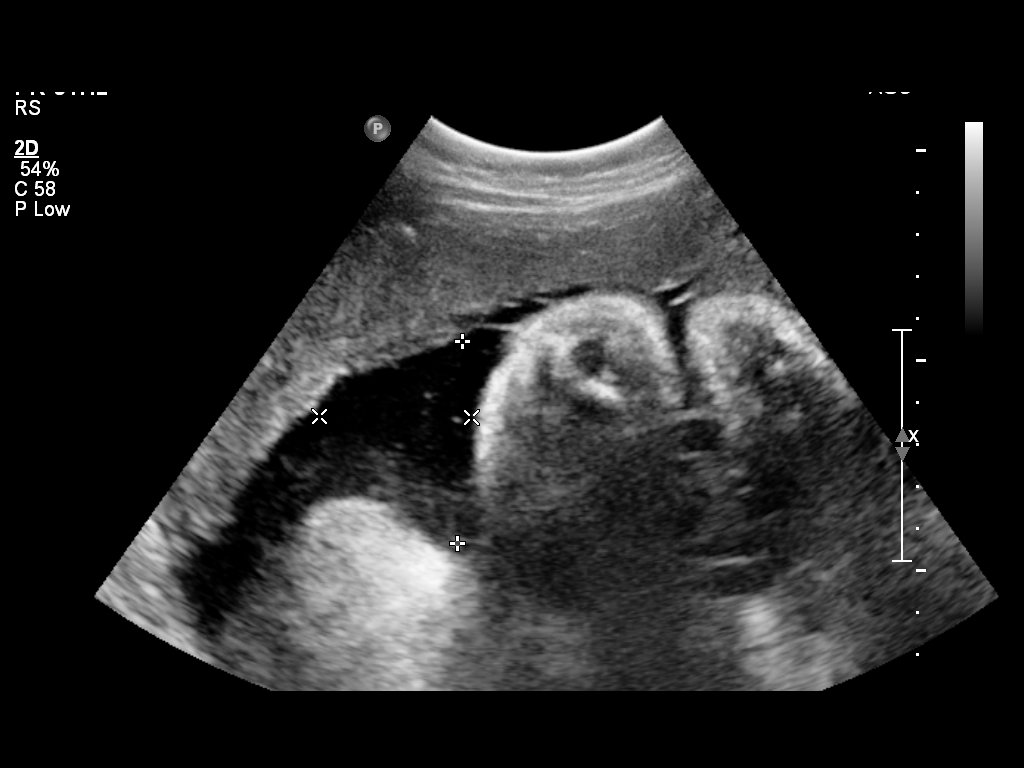
[im 3/8]
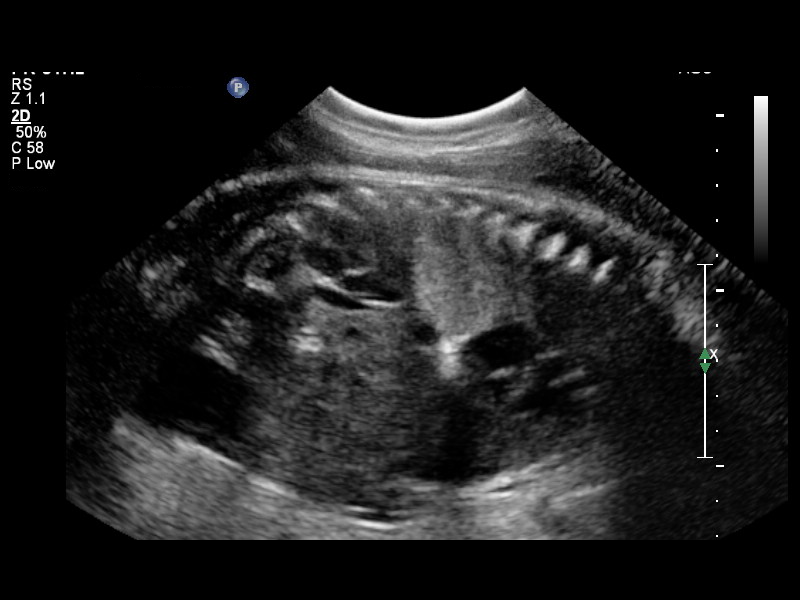
[im 4/8]
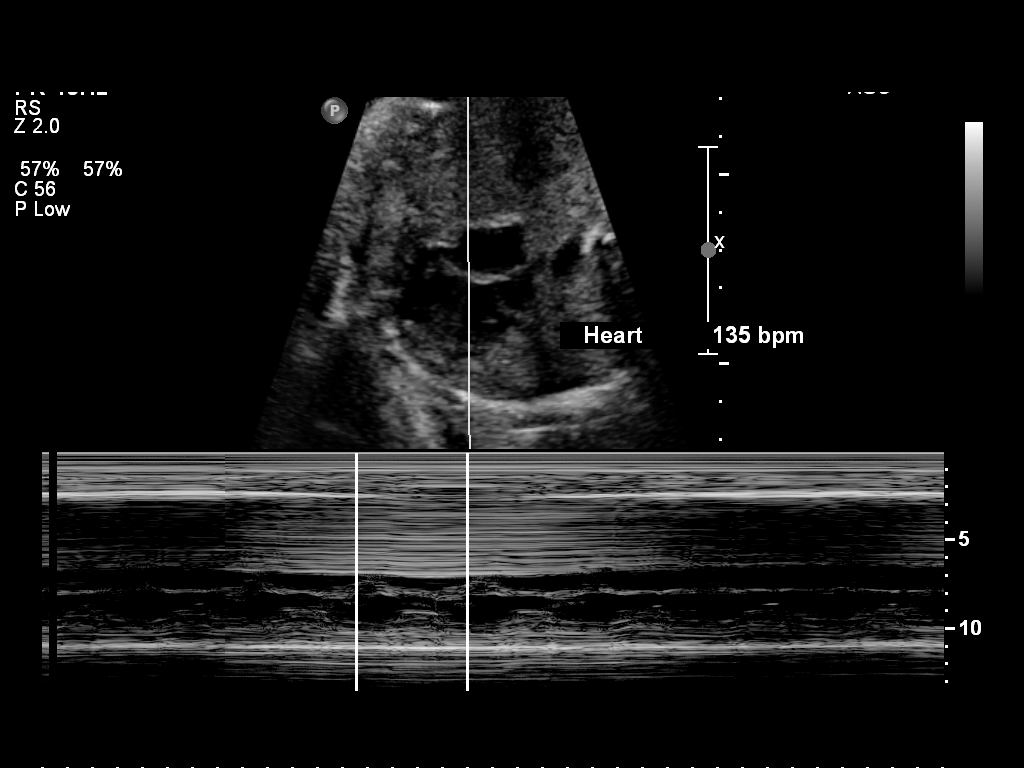
[im 5/8]
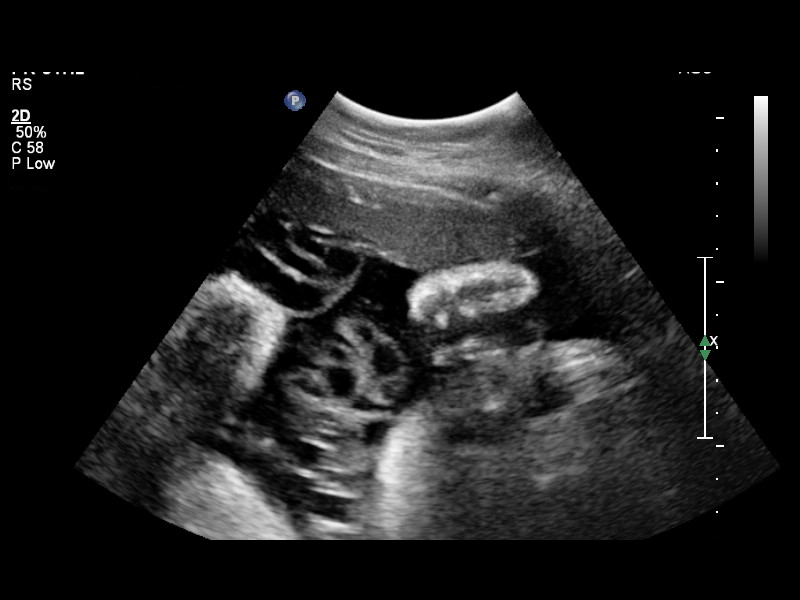
[im 6/8]
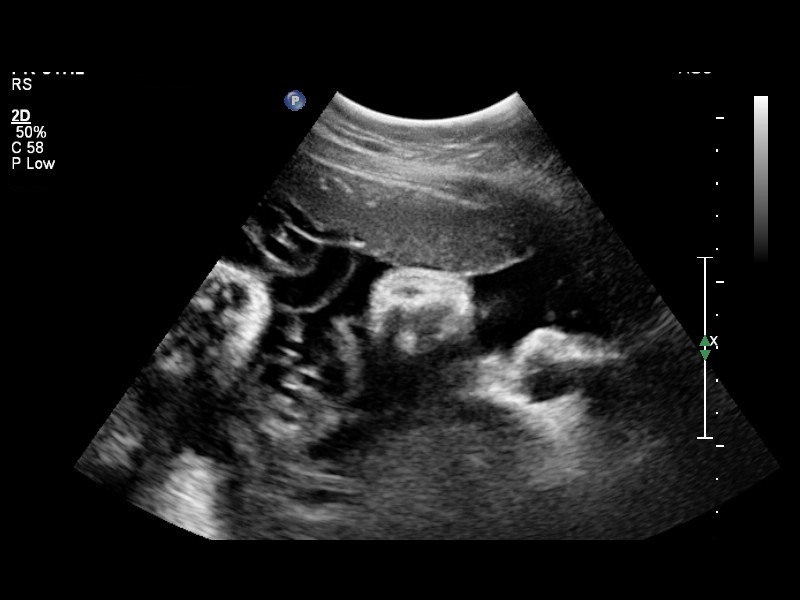
[im 7/8]
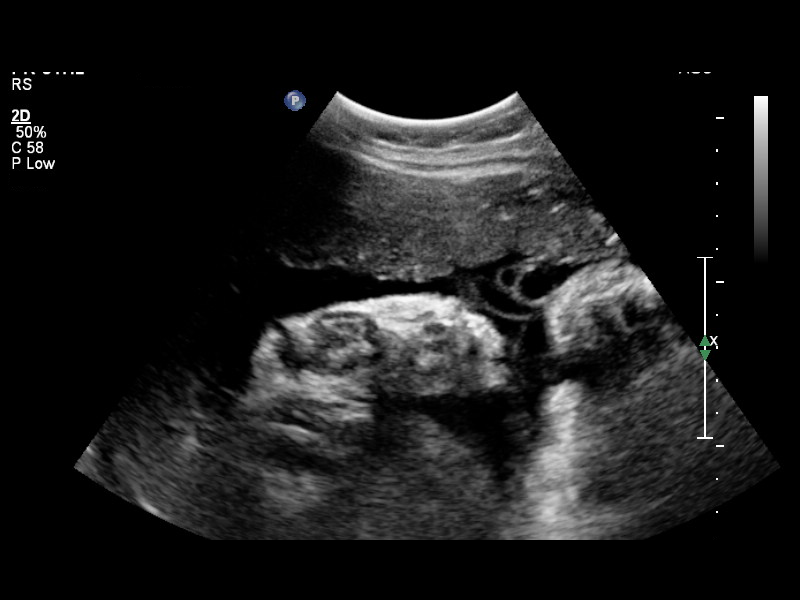
[im 8/8]
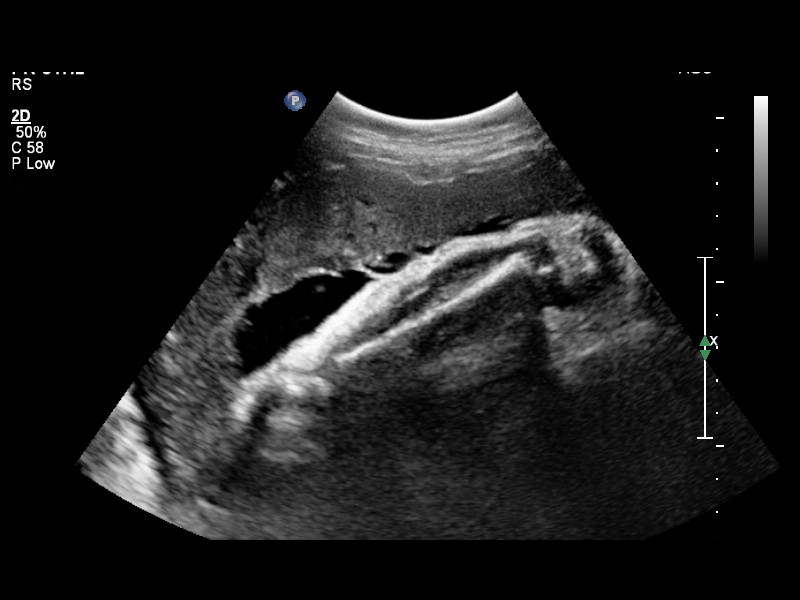

[8 of 8 positions shown; findings below may reference images not displayed]

FINDINGS: Single fetus in cephalic presentation.

Biophysical profile was performed.  Biophysical profile score is
[DATE] in 9 minutes.    Fetal heart rate of 135 beats per minute.
IMPRESSION: Biophysical profile [DATE].

## 2014-01-31 ENCOUNTER — Encounter (HOSPITAL_COMMUNITY): Payer: Self-pay | Admitting: *Deleted

## 2014-02-27 DIAGNOSIS — J069 Acute upper respiratory infection, unspecified: Secondary | ICD-10-CM | POA: Insufficient documentation

## 2014-02-27 DIAGNOSIS — Z79899 Other long term (current) drug therapy: Secondary | ICD-10-CM | POA: Insufficient documentation

## 2014-02-27 DIAGNOSIS — Z8632 Personal history of gestational diabetes: Secondary | ICD-10-CM | POA: Insufficient documentation

## 2014-02-27 DIAGNOSIS — Z91040 Latex allergy status: Secondary | ICD-10-CM | POA: Insufficient documentation

## 2014-02-28 ENCOUNTER — Emergency Department (HOSPITAL_COMMUNITY)
Admission: EM | Admit: 2014-02-28 | Discharge: 2014-02-28 | Disposition: A | Discharge status: Home / Self Care | Payer: 59 | Attending: Emergency Medicine | Admitting: Emergency Medicine

## 2014-02-28 ENCOUNTER — Encounter (HOSPITAL_COMMUNITY): Payer: Self-pay | Admitting: Emergency Medicine

## 2014-02-28 DIAGNOSIS — J069 Acute upper respiratory infection, unspecified: Secondary | ICD-10-CM

## 2014-02-28 LAB — RAPID STREP SCREEN (MED CTR MEBANE ONLY): STREPTOCOCCUS, GROUP A SCREEN (DIRECT): NEGATIVE

## 2014-02-28 MED ORDER — IBUPROFEN 600 MG PO TABS: 600.0000 mg | ORAL_TABLET | Freq: Four times a day (QID) | ORAL | Status: DC | PRN

## 2014-02-28 NOTE — ED Notes (Signed)
C/o sore throat since Wednesday. ?

## 2014-02-28 NOTE — ED Provider Notes (Signed)
CSN: [id number]     Arrival date & time 02/27/14  2352 History   First MD Initiated Contact with Patient 02/28/14 0122     Chief Complaint  Patient presents with  . Sore Throat     (Consider location/radiation/quality/duration/timing/severity/associated sxs/prior Treatment) HPI [name] is a 43 year old female with no significant past medical history who presents the ER with 5 days of sore throat and nonproductive cough. Patient reports her sore throat began Wednesday, and has since persisted. Patient states she has had a mild cough since Wednesday also, patient reports using over-the-counter Mucinex and Delsym for her cough, however has had minimal relief in her pain and discomfort of her throat. Patient denies sinus congestion, headache, neck pain, fever, dizziness, blurred vision, chest pain, shortness of breath, abdominal pain, nausea, vomiting, diarrhea. Patient denies any recent sick contacts or travel outside the country.  Past Medical History  Diagnosis Date  . Gestational diabetes    Past Surgical History  Procedure Laterality Date  . Cesarean section    . Breast reduction surgery    . Salpingectomy  1998   Family History  Problem Relation Age of Onset  . Hypertension Mother   . Diabetes Father   . Diabetes Sister   . Cancer Maternal Aunt   . Cancer Maternal Uncle    History  Substance Use Topics  . Smoking status: Never Smoker   . Smokeless tobacco: Not on file  . Alcohol Use: No   OB History    Gravida Para Term Preterm AB TAB SAB Ectopic Multiple Living   7 5 5  2 1  1  5      Review of Systems  Constitutional: Negative for fever.  HENT: Positive for sore throat. Negative for trouble swallowing and voice change.   Eyes: Negative for visual disturbance.  Respiratory: Positive for cough. Negative for shortness of breath.   Cardiovascular: Negative for chest pain.  Gastrointestinal: Negative for nausea, vomiting and abdominal pain.  Genitourinary: Negative  for dysuria.  Musculoskeletal: Negative for neck pain.  Skin: Negative for rash.  Neurological: Negative for dizziness, weakness and numbness.  Psychiatric/Behavioral: Negative.       Allergies  Latex  Home Medications   Prior to Admission medications   Medication Sig Start Date End Date Taking? Authorizing Provider  ciclopirox (PENLAC) 8 % solution Apply 1 application topically once a week. Apply over nail and surrounding skin. Apply daily over previous coat. After seven (7) days, may remove with alcohol and continue cycle.    Historical Provider, MD  ibuprofen (ADVIL,MOTRIN) 600 MG tablet Take 1 tablet (600 mg total) by mouth every 6 (six) hours as needed. 02/28/14   Monte FantasiaJoseph W Elihu Milstein, PA-C  Prenatal Vit-Fe Fumarate-FA (PRENATAL MULTIVITAMIN) TABS Take 1 tablet by mouth at bedtime.    Historical Provider, MD   BP 132/91 mmHg  Pulse 101  Temp(Src) 99.5 F (37.5 C) (Oral)  Resp 20  Ht 4\' 11"  (1.499 m)  Wt 167 lb (75.751 kg)  BMI 33.71 kg/m2  SpO2 100%  LMP 01/30/2014 Physical Exam  Constitutional: She is oriented to person, place, and time. She appears well-developed and well-nourished. No distress.  HENT:  Head: Normocephalic and atraumatic.  Right Ear: Tympanic membrane normal.  Left Ear: Tympanic membrane normal.  Nose: Rhinorrhea present.  Mouth/Throat: Uvula is midline and mucous membranes are normal. No trismus in the jaw. Posterior oropharyngeal erythema present. No oropharyngeal exudate, posterior oropharyngeal edema or tonsillar abscesses.  Mild erythema with no edema noted  to the nasal turbinates bilaterally.  Eyes: Right eye exhibits no discharge. Left eye exhibits no discharge. No scleral icterus.  Neck: Normal range of motion and full passive range of motion without pain. No spinous process tenderness and no muscular tenderness present. No rigidity.  Cardiovascular: Normal rate, regular rhythm, S1 normal, S2 normal and normal heart sounds.   Pulses:      Radial  pulses are 2+ on the right side, and 2+ on the left side.  Heart rate 80 exam.  Pulmonary/Chest: Effort normal and breath sounds normal. No accessory muscle usage. No tachypnea. No respiratory distress.  Musculoskeletal: Normal range of motion.  Neurological: She is alert and oriented to person, place, and time.  Skin: Skin is warm and dry. She is not diaphoretic.  Psychiatric: She has a normal mood and affect.  Nursing note and vitals reviewed.   ED Course  Procedures (including critical care time) Labs Review Labs Reviewed  RAPID STREP SCREEN  CULTURE, GROUP A STREP    Imaging Review No results found.   EKG Interpretation None      MDM   Final diagnoses:  URI (upper respiratory infection)   Pt afebrile without tonsillar exudate, negative strep. Presents with non-productive cough, mild cervical lymphadenopathy, & dysphagia; diagnosis of viral pharyngitis vs URI. No abx indicated. DC w symptomatic tx for pain  Pt does not appear dehydrated, but did discuss importance of water rehydration. Patient tachycardic at 101 triage, with heart rate in 80s on my exam. Presentation non concerning for PTA or infxn spread to soft tissue. No trismus or uvula deviation. Specific return precautions discussed. Pt able to drink water in ED without difficulty with intact air way. Recommended PCP follow up.   Signed,  Ladona MowJoe Kalee Mcclenathan, PA-C 2:48 AM   Monte FantasiaJoseph W Shakeema Lippman, PA-C 02/28/14 [id number]  Olivia Mackielga M Otter, MD 02/28/14 [phone]

## 2014-02-28 NOTE — Discharge Instructions (Signed)
Upper Respiratory Infection, Adult °An upper respiratory infection (URI) is also sometimes known as the common cold. The upper respiratory tract includes the nose, sinuses, throat, trachea, and bronchi. Bronchi are the airways leading to the lungs. Most people improve within 1 week, but symptoms can last up to 2 weeks. A residual cough may last even longer.  °CAUSES °Many different viruses can infect the tissues lining the upper respiratory tract. The tissues become irritated and inflamed and often become very moist. Mucus production is also common. A cold is contagious. You can easily spread the virus to others by oral contact. This includes kissing, sharing a glass, coughing, or sneezing. Touching your mouth or nose and then touching a surface, which is then touched by another person, can also spread the virus. °SYMPTOMS  °Symptoms typically develop 1 to 3 days after you come in contact with a cold virus. Symptoms vary from person to person. They may include: °· Runny nose. °· Sneezing. °· Nasal congestion. °· Sinus irritation. °· Sore throat. °· Loss of voice (laryngitis). °· Cough. °· Fatigue. °· Muscle aches. °· Loss of appetite. °· Headache. °· Low-grade fever. °DIAGNOSIS  °You might diagnose your own cold based on familiar symptoms, since most people get a cold 2 to 3 times a year. Your caregiver can confirm this based on your exam. Most importantly, your caregiver can check that your symptoms are not due to another disease such as strep throat, sinusitis, pneumonia, asthma, or epiglottitis. Blood tests, throat tests, and X-rays are not necessary to diagnose a common cold, but they may sometimes be helpful in excluding other more serious diseases. Your caregiver will decide if any further tests are required. °RISKS AND COMPLICATIONS  °You may be at risk for a more severe case of the common cold if you smoke cigarettes, have chronic heart disease (such as heart failure) or lung disease (such as asthma), or if  you have a weakened immune system. The very young and very old are also at risk for more serious infections. Bacterial sinusitis, middle ear infections, and bacterial pneumonia can complicate the common cold. The common cold can worsen asthma and chronic obstructive pulmonary disease (COPD). Sometimes, these complications can require emergency medical care and may be life-threatening. °PREVENTION  °The best way to protect against getting a cold is to practice good hygiene. Avoid oral or hand contact with people with cold symptoms. Wash your hands often if contact occurs. There is no clear evidence that vitamin C, vitamin E, echinacea, or exercise reduces the chance of developing a cold. However, it is always recommended to get plenty of rest and practice good nutrition. °TREATMENT  °Treatment is directed at relieving symptoms. There is no cure. Antibiotics are not effective, because the infection is caused by a virus, not by bacteria. Treatment may include: °· Increased fluid intake. Sports drinks offer valuable electrolytes, sugars, and fluids. °· Breathing heated mist or steam (vaporizer or shower). °· Eating chicken soup or other clear broths, and maintaining good nutrition. °· Getting plenty of rest. °· Using gargles or lozenges for comfort. °· Controlling fevers with ibuprofen or acetaminophen as directed by your caregiver. °· Increasing usage of your inhaler if you have asthma. °Zinc gel and zinc lozenges, taken in the first 24 hours of the common cold, can shorten the duration and lessen the severity of symptoms. Pain medicines may help with fever, muscle aches, and throat pain. A variety of non-prescription medicines are available to treat congestion and runny nose. Your caregiver   can make recommendations and may suggest nasal or lung inhalers for other symptoms.  °HOME CARE INSTRUCTIONS  °· Only take over-the-counter or prescription medicines for pain, discomfort, or fever as directed by your  caregiver. °· Use a warm mist humidifier or inhale steam from a shower to increase air moisture. This may keep secretions moist and make it easier to breathe. °· Drink enough water and fluids to keep your urine clear or pale yellow. °· Rest as needed. °· Return to work when your temperature has returned to normal or as your caregiver advises. You may need to stay home longer to avoid infecting others. You can also use a face mask and careful hand washing to prevent spread of the virus. °SEEK MEDICAL CARE IF:  °· After the first few days, you feel you are getting worse rather than better. °· You need your caregiver's advice about medicines to control symptoms. °· You develop chills, worsening shortness of breath, or brown or red sputum. These may be signs of pneumonia. °· You develop yellow or brown nasal discharge or pain in the face, especially when you bend forward. These may be signs of sinusitis. °· You develop a fever, swollen neck glands, pain with swallowing, or white areas in the back of your throat. These may be signs of strep throat. °SEEK IMMEDIATE MEDICAL CARE IF:  °· You have a fever. °· You develop severe or persistent headache, ear pain, sinus pain, or chest pain. °· You develop wheezing, a prolonged cough, cough up blood, or have a change in your usual mucus (if you have chronic lung disease). °· You develop sore muscles or a stiff neck. °Document Released: 09/11/2000 Document Revised: 06/10/2011 Document Reviewed: 06/23/2013 °ExitCare® Patient Information ©2015 [employer]. This information is not intended to replace advice given to you by your health care provider. Make sure you discuss any questions you have with your health care provider. ° °Emergency Department Resource Guide °1) Find a Doctor and Pay Out of Pocket °Although you won't have to find out who is covered by your insurance plan, it is a good idea to ask around and get recommendations. You will then need to call the office and see if  the doctor you have chosen will accept you as a new patient and what types of options they offer for patients who are self-pay. Some doctors offer discounts or will set up payment plans for their patients who do not have insurance, but you will need to ask so you aren't surprised when you get to your appointment. ° °2) Contact Your Local Health Department °Not all health departments have doctors that can see patients for sick visits, but many do, so it is worth a call to see if yours does. If you don't know where your local health department is, you can check in your phone book. The CDC also has a tool to help you locate your state's health department, and many state websites also have listings of all of their local health departments. ° °3) Find a Walk-in Clinic °If your illness is not likely to be very severe or complicated, you may want to try a walk in clinic. These are popping up all over the country in pharmacies, drugstores, and shopping centers. They're usually staffed by nurse practitioners or physician assistants that have been trained to treat common illnesses and complaints. They're usually fairly quick and inexpensive. However, if you have serious medical issues or chronic medical problems, these are probably not your best option. ° °  No Primary Care Doctor: °- Call [facility] at  832-8000 - they can help you locate a primary care doctor that  accepts your insurance, provides certain services, etc. °- Physician Referral Service- 1-800-533-3463 ° °Chronic Pain Problems: °Organization         Address  Phone   Notes  °[facility]  (336) 297-2271 Patients need to be referred by their primary care doctor.  ° °Medication Assistance: °Organization         Address  Phone   Notes  °[place] [facility] [address] °[place], [place] [address] (336) 641-8030 --Must be a resident of [place] °-- Must have NO insurance coverage whatsoever (no  Medicaid/ Medicare, etc.) °-- The pt. MUST have a primary care doctor that directs their care regularly and follows them in the community °  °[facility]  (866) 331-1348   °[employer]  (888) 892-1162   ° °Agencies that provide inexpensive medical care: °Organization         Address  Phone   Notes  °[facility]  (336) 832-8035   °[facility]    (336) 832-7272   °[facility] [address] °[place], [place] [address] (336) 832-4777   °[facility] [address], °[place] (336) 271-4999   °[facility]    (336) 373-0678   °[facility]    (336) 272-1050   °[facility] ° [address], [place] Phone:  (336) 832-4444, Fax:  (336) 832-4440 Hours of Operation:  9 am - 6 pm, M-F.  Also accepts Medicaid/Medicare and self-pay.  °[facility] ° [address], [place] Phone: (336) 832-3150, Fax: (336) [phone]. Hours of Operation:  8:30 am - 5:30 pm, M-F.  Also accepts Medicaid and self-pay.  °[facility] [place] [address], [place] Phone: (336) 878-6027   °[facility] [address], [place], [place] (336)723-1848, Ext. 123 Mondays & Thursdays: 7-9 AM.  First 15 patients are seen on a first come, first serve basis. °  ° °Medicaid-accepting [place] Providers: ° °Organization         Address  Phone   Notes  °[facility] [address], [place] (336) 641-2100 Also accepts self-pay patients.  °[facility] [address], [place] ° (336) 856-9996   °[facility] [address], [place] (336) 288-8857   °[facility] [address] [place] [address], [place] (336) 299-7000   °Veita Bland [address], [place]  ° (336) 373-1557 Only accepts [place] Access Medicaid patients after they have their name applied to their card.   ° °Self-Pay (no insurance) in [place]: ° °Organization         Address  Phone   Notes  °Sickle Cell Patients, [facility] [address], [place] (336) 832-1970   °[facility] [address], [place] (336) 832-4400   °[facility] ° [address],  (336) 992-4800   °[facility]/Dr. Osei-Bonsu ° [address] [place] [address], [place] or [address], [place] (336) 841-8500 Phone number for both [place] and [place] locations is the same.  °[facility] [address],  (336) 299-0000   °  [facility] [address] [place] [address], [place] or [address] (336) 852-7530 °(336) 878-2260   °[facility] [address], [place] (336) 350-1642, phone; (336) 294-5005, fax Sees patients 1st and 3rd Saturday of every month.  Must not qualify for public or private insurance (i.e. Medicaid, Medicare, [place] Health Choice, Veterans' Benefits) • Household income should be no more than 200% of the poverty level •The clinic cannot treat you if you are pregnant or think you are pregnant • Sexually transmitted diseases are not treated at the clinic.  ° ° °Dental Care: °Organization         Address  Phone  Notes  °[facility] [facility] [address], [place] (336) 641-6152 Accepts children up to age 21 who are enrolled in Medicaid or [place] Health Choice; pregnant women with a Medicaid card; and children who have applied for Medicaid or [place] Health Choice, but were declined, whose parents can pay a reduced fee at time of service.  °[facility] [place]  [address], [place] (336) 641-7733 Accepts children up to age 21 who are enrolled in Medicaid or [place] Health Choice; pregnant women with a Medicaid card; and children who have applied for Medicaid or [place] Health Choice,  but were declined, whose parents can pay a reduced fee at time of service.  °[facility] ° [address],  (336) 641-4533 Patients are seen by appointment only. Walk-ins are not accepted. [facility] will see patients 18 years of age and older. °Monday - Tuesday (8am-5pm) °Most Wednesdays (8:30-5pm) °$30 per visit, cash only  °[facility] ° [address], [place] (336) 641-4533 Patients are seen by appointment only. Walk-ins are not accepted. [facility] will see patients 18 years of age and older. °One Wednesday Evening (Monthly: Volunteer Based).  $30 per visit, cash only  °[facility]  (919) 537-3737 for adults; Children under age 4, call Graduate Pediatric Dentistry at ([phone]) [phone]. Children aged 4-14, please call (919) [phone] to request a pediatric application. ° Dental services are provided in all areas of dental care including fillings, crowns and bridges, complete and partial dentures, implants, gum treatment, root canals, and extractions. Preventive care is also provided. Treatment is provided to both adults and children. °Patients are selected via a lottery and there is often a waiting list. °  °[facility] [address], ° ° (336) 763-8833 [email or web address] °  °[facility] [address], [place], [place] (336)723-1848, Ext. 123 Second and Fourth Thursday of each month, opens at 6:30 AM; Clinic ends at 9 AM.  Patients are seen on a first-come first-served basis, and a limited number are seen during each clinic.  ° °[facility] ° [address], [place], [place] (336) 723-7904   Eligibility Requirements °You must have lived in [place], [place], or [place] counties for at least the last three months. °  You cannot be eligible for state or federal sponsored healthcare insurance, including Veterans Administration, Medicaid, or Medicare. °  You generally  cannot be eligible for healthcare insurance through your employer.  °  How to apply: °Eligibility screenings are held every Tuesday and Wednesday afternoon from 1:00 pm until 4:00 pm. You do not need an appointment for the interview!  °[facility] [address], [place], [place] 336-631-2330   °  [place] [facility]  336-342-8273   °[place]  336-703-3100   °[facility]  336-570-6415   ° °Behavioral Health Resources in the Community: °Intensive Outpatient Programs °Organization         Address  Phone  Notes  °[facility] [address], [place], [place] 336-878-6098   °[facility] [address], [place], [place] 336-832-9800   °[facility] [address], [place], [place] ° 336-882-2125   °[facility] [address],  °[place], [place] 1-800-853-5163 or 336-641-4981   °Substance Abuse Resources °Organization         Address  Phone  Notes  °[facility]  336-882-2125   °[facility]  336-784-9470   °[facility]  336-285-9073   °[facility]  336-845-3988   °Residential & Outpatient Substance Abuse Program  1-800-659-3381   °Psychological Services °Organization         Address  Phone  Notes  °[facility]  336- 832-9600   °[facility]  336- 378-7881   °[facility] [address], [place] 1-800-853-5163 or 336-641-4981   ° °Mobile Crisis Teams °Organization         Address  Phone  Notes  °[facility], Mobile Crisis Care Unit  1-877-626-1772   °Assertive °[facility] ° [address] [place], [place] 336-834-9664   °Sharon DeEsch [address] °[place] 336-554-5454   ° °Self-Help/Support Groups °Organization         Address  Phone             Notes  °[facility] [place] [facility] - variety of support groups  336- 373-1402 Call for more  information  °Narcotics Anonymous (NA), [facility] [address], °[place]  2 meetings at this location  ° °Residential Treatment Programs °Organization         Address  Phone  Notes  °[facility] [address],    °[place]  1-866-801-8205   °[facility] ° [address], [place], [place] 704-293-8524   °[facility] [address], [place] 336-845-3988 Admissions: 8am-3pm M-F  °[facility] [address]    °[place], [place] 336-841-1104   °[facility] [address], [place], [place] 336-379-7146   °[facility] [address]  °[place], [place] 336-285-9073   °[facility] Intensive Outpatient [address], [place], [place] 336-852-3033   °[facility] [address]  °[place], [place] 1-877-615-2722 or 336-784-9470   °[facility] [address] [place], [place] 336-227-7417 Accepts Medicaid  °[facility] [address]  ° [place] 1-800-659-3381 Substance Abuse/Addiction Treatment  ° °[place] Behavioral Health Resources °Organization         Address  Phone  Notes  °[facility]  (888) 581-9988   °Julie Brannon, PhD [address] [place],    (336) 349-5553 or (336) 951-0000   °[facility]   [address] °,  (336) 349-4454   °[facility] [address], [place],  (336) 342-8316 Insurance/Medicaid/sponsorship through [facility]  °[facility] [address]                                      [place], [place] (336) 342-8316 Therapy/tele-psych/case  °[facility] [address]  ° [place], [place] (336) 349-2233    °Dr. Arfeen  (336) 349-4544   °[facility] [place]  [employer] [place] [facility] 1) [address], [place] °2) [address], [place] °3)  [address], [place] (336)  349-3220 °(336) 342-7768 ° °(336) 342-8140   °[place] [facility] (336) 342-1394 or (336) 342-3537 (After Hours)    ° ° °

## 2014-03-02 LAB — CULTURE, GROUP A STREP

## 2015-06-21 ENCOUNTER — Other Ambulatory Visit: Payer: Self-pay | Admitting: Internal Medicine

## 2015-06-21 DIAGNOSIS — R202 Paresthesia of skin: Secondary | ICD-10-CM

## 2016-08-22 ENCOUNTER — Inpatient Hospital Stay (HOSPITAL_COMMUNITY)
Admission: AD | Admit: 2016-08-22 | Discharge: 2016-08-22 | Disposition: A | Discharge status: Home / Self Care | Payer: BLUE CROSS/BLUE SHIELD | Source: Ambulatory Visit | Attending: Obstetrics and Gynecology | Admitting: Obstetrics and Gynecology

## 2016-08-22 ENCOUNTER — Encounter (HOSPITAL_COMMUNITY): Payer: Self-pay | Admitting: *Deleted

## 2016-08-22 ENCOUNTER — Inpatient Hospital Stay (HOSPITAL_COMMUNITY): Payer: BLUE CROSS/BLUE SHIELD

## 2016-08-22 DIAGNOSIS — O26851 Spotting complicating pregnancy, first trimester: Secondary | ICD-10-CM

## 2016-08-22 DIAGNOSIS — Z3A01 Less than 8 weeks gestation of pregnancy: Secondary | ICD-10-CM | POA: Diagnosis not present

## 2016-08-22 DIAGNOSIS — Z833 Family history of diabetes mellitus: Secondary | ICD-10-CM | POA: Diagnosis not present

## 2016-08-22 DIAGNOSIS — O26899 Other specified pregnancy related conditions, unspecified trimester: Secondary | ICD-10-CM

## 2016-08-22 DIAGNOSIS — N939 Abnormal uterine and vaginal bleeding, unspecified: Secondary | ICD-10-CM | POA: Diagnosis present

## 2016-08-22 DIAGNOSIS — Z91040 Latex allergy status: Secondary | ICD-10-CM | POA: Insufficient documentation

## 2016-08-22 DIAGNOSIS — Z6790 Unspecified blood type, Rh positive: Secondary | ICD-10-CM

## 2016-08-22 DIAGNOSIS — Z8249 Family history of ischemic heart disease and other diseases of the circulatory system: Secondary | ICD-10-CM | POA: Diagnosis not present

## 2016-08-22 DIAGNOSIS — O09521 Supervision of elderly multigravida, first trimester: Secondary | ICD-10-CM | POA: Diagnosis not present

## 2016-08-22 DIAGNOSIS — O209 Hemorrhage in early pregnancy, unspecified: Secondary | ICD-10-CM | POA: Insufficient documentation

## 2016-08-22 DIAGNOSIS — R109 Unspecified abdominal pain: Secondary | ICD-10-CM

## 2016-08-22 LAB — URINALYSIS, ROUTINE W REFLEX MICROSCOPIC
Bilirubin Urine: NEGATIVE
GLUCOSE, UA: NEGATIVE mg/dL
Ketones, ur: 5 mg/dL — AB
LEUKOCYTES UA: NEGATIVE
NITRITE: NEGATIVE
PROTEIN: 30 mg/dL — AB
SPECIFIC GRAVITY, URINE: 1.026 (ref 1.005–1.030)
pH: 6 (ref 5.0–8.0)

## 2016-08-22 LAB — WET PREP, GENITAL
CLUE CELLS WET PREP: NONE SEEN
SPERM: NONE SEEN
TRICH WET PREP: NONE SEEN
Yeast Wet Prep HPF POC: NONE SEEN

## 2016-08-22 LAB — POCT PREGNANCY, URINE: Preg Test, Ur: POSITIVE — AB

## 2016-08-22 LAB — HCG, QUANTITATIVE, PREGNANCY: hCG, Beta Chain, Quant, S: 10000 m[IU]/mL — ABNORMAL HIGH (ref <5)

## 2016-08-22 NOTE — Discharge Instructions (Signed)
Vaginal Bleeding During Pregnancy, First Trimester °A small amount of bleeding (spotting) from the vagina is common in early pregnancy. Sometimes the bleeding is normal and is not a problem, and sometimes it is a sign of something serious. Be sure to tell your doctor about any bleeding from your vagina right away. °Follow these instructions at home: °· Watch your condition for any changes. °· Follow your doctor's instructions about how active you can be. °· If you are on bed rest: °¨ You may need to stay in bed and only get up to use the bathroom. °¨ You may be allowed to do some activities. °¨ If you need help, make plans for someone to help you. °· Write down: °¨ The number of pads you use each day. °¨ How often you change pads. °¨ How soaked (saturated) your pads are. °· Do not use tampons. °· Do not douche. °· Do not have sex or orgasms until your doctor says it is okay. °· If you pass any tissue from your vagina, save the tissue so you can show it to your doctor. °· Only take medicines as told by your doctor. °· Do not take aspirin because it can make you bleed. °· Keep all follow-up visits as told by your doctor. °Contact a doctor if: °· You bleed from your vagina. °· You have cramps. °· You have labor pains. °· You have a fever that does not go away after you take medicine. °Get help right away if: °· You have very bad cramps in your back or belly (abdomen). °· You pass large clots or tissue from your vagina. °· You bleed more. °· You feel light-headed or weak. °· You pass out (faint). °· You have chills. °· You are leaking fluid or have a gush of fluid from your vagina. °· You pass out while pooping (having a bowel movement). °This information is not intended to replace advice given to you by your health care provider. Make sure you discuss any questions you have with your health care provider. °Document Released: 08/02/2013 Document Revised: 08/24/2015 Document Reviewed: 11/23/2012 °Elsevier Interactive  Patient Education © 2017 Elsevier Inc. ° °

## 2016-08-22 NOTE — MAU Note (Signed)
Reports she took home preg test last week and it was positive, started having bleeding and cramping since yesterday. Sees blood when she wipes after using the bathroom.

## 2016-08-22 NOTE — MAU Provider Note (Signed)
  History     CSN: [id number]  Arrival date and time: 08/22/16 1207   None     Chief Complaint  Patient presents with  . Vaginal Bleeding   HPI 46 yo [id number] 8 3/7 wks by LMP 06/24/16, presents to MAU with cramps and vaginal spotting since yesterday. No N/V. + home UPT about 1 1/2 weeks ago.   H/O TSVD x 4, Term c section, ectopic x 1 unknown location, SAB x 1 No chronic medical problems or meds NKDA   Past Medical History:  Diagnosis Date  . Gestational diabetes     Past Surgical History:  Procedure Laterality Date  . BREAST REDUCTION SURGERY    . CESAREAN SECTION    . SALPINGECTOMY  1998    Family History  Problem Relation Age of Onset  . Hypertension Mother   . Diabetes Father   . Diabetes Sister   . Cancer Maternal Aunt   . Cancer Maternal Uncle     Social History  Substance Use Topics  . Smoking status: Never Smoker  . Smokeless tobacco: Never Used  . Alcohol use No    Allergies:  Allergies  Allergen Reactions  . Latex Itching    Prescriptions Prior to Admission  Medication Sig Dispense Refill Last Dose  . ciclopirox (PENLAC) 8 % solution Apply 1 application topically once a week. Apply over nail and surrounding skin. Apply daily over previous coat. After seven (7) days, may remove with alcohol and continue cycle.   Past Week at Unknown  . ibuprofen (ADVIL,MOTRIN) 600 MG tablet Take 1 tablet (600 mg total) by mouth every 6 (six) hours as needed. 30 tablet 0   . Prenatal Vit-Fe Fumarate-FA (PRENATAL MULTIVITAMIN) TABS Take 1 tablet by mouth at bedtime.   11/24/2011 at Unknown    Review of Systems  Constitutional: Negative.   Respiratory: Negative.   Cardiovascular: Negative.   Gastrointestinal: Negative.   Genitourinary: Positive for vaginal bleeding.   Physical Exam   Blood pressure 130/79, pulse 66, temperature 98.4 F (36.9 C), temperature source Oral, resp. rate 16, height 4\' 11"  (1.499 m), weight 78 kg (172 lb), last menstrual period  06/21/2016, unknown if currently breastfeeding.  Physical Exam  Constitutional: She appears well-developed and well-nourished.  Cardiovascular: Normal rate, regular rhythm and normal heart sounds.   Respiratory: Effort normal and breath sounds normal.  GI: Soft. Bowel sounds are normal.  Genitourinary:  Genitourinary Comments: Nl EGBUS, scant blood noted in vaginal vault, no active bleeding noted, cultures obtained, uterus < 8 weeks, mobile, non tender, no adnexal masses or tenderness    MAU Course  Procedures U/S demonstrated GS and YS but no fetal pole   Assessment and Plan  Early IUP  Pt reassured. Will repeat U/S in 7 to 10 days.   Hermina StaggersMichael L Latreshia Beauchaine 08/22/2016, 1:19 PM

## 2016-08-27 LAB — GC/CHLAMYDIA PROBE AMP (~~LOC~~) NOT AT ARMC
CHLAMYDIA, DNA PROBE: NEGATIVE
NEISSERIA GONORRHEA: NEGATIVE

## 2016-08-30 ENCOUNTER — Ambulatory Visit: Payer: BLUE CROSS/BLUE SHIELD | Admitting: *Deleted

## 2016-08-30 ENCOUNTER — Ambulatory Visit (HOSPITAL_COMMUNITY)
Admission: RE | Admit: 2016-08-30 | Discharge: 2016-08-30 | Disposition: A | Discharge status: Home / Self Care | Payer: BLUE CROSS/BLUE SHIELD | Source: Ambulatory Visit | Attending: Certified Nurse Midwife | Admitting: Certified Nurse Midwife

## 2016-08-30 DIAGNOSIS — Z3A00 Weeks of gestation of pregnancy not specified: Secondary | ICD-10-CM | POA: Insufficient documentation

## 2016-08-30 DIAGNOSIS — Z712 Person consulting for explanation of examination or test findings: Secondary | ICD-10-CM

## 2016-08-30 DIAGNOSIS — O3680X0 Pregnancy with inconclusive fetal viability, not applicable or unspecified: Secondary | ICD-10-CM

## 2016-08-30 DIAGNOSIS — O26851 Spotting complicating pregnancy, first trimester: Secondary | ICD-10-CM | POA: Insufficient documentation

## 2016-08-30 NOTE — Progress Notes (Signed)
Patient presents to clinic for u/s results. Reviewed findings with Dr Adrian BlackwaterStinson who ordered a f/u u/s in 10-14 days to access viability. Results and need for f/u discussed with patient who voiced understanding. She expressed that she was not certain if she would keep the pregnancy if viable but was willing to come in for another u/s if viability is in question. States she is still having some vaginal bleeding but no pain. U/s scheduled for 6/12 @ 0800.

## 2016-09-03 ENCOUNTER — Encounter (HOSPITAL_COMMUNITY): Payer: Self-pay | Admitting: *Deleted

## 2016-09-03 ENCOUNTER — Inpatient Hospital Stay (HOSPITAL_COMMUNITY)
Admission: AD | Admit: 2016-09-03 | Discharge: 2016-09-03 | Disposition: A | Discharge status: Home / Self Care | Payer: BLUE CROSS/BLUE SHIELD | Source: Ambulatory Visit | Attending: Obstetrics and Gynecology | Admitting: Obstetrics and Gynecology

## 2016-09-03 ENCOUNTER — Inpatient Hospital Stay (HOSPITAL_COMMUNITY): Payer: BLUE CROSS/BLUE SHIELD

## 2016-09-03 DIAGNOSIS — O209 Hemorrhage in early pregnancy, unspecified: Secondary | ICD-10-CM | POA: Diagnosis present

## 2016-09-03 DIAGNOSIS — O039 Complete or unspecified spontaneous abortion without complication: Secondary | ICD-10-CM | POA: Diagnosis not present

## 2016-09-03 DIAGNOSIS — Z3A08 8 weeks gestation of pregnancy: Secondary | ICD-10-CM | POA: Diagnosis not present

## 2016-09-03 LAB — CBC
HCT: 36.2 % (ref 36.0–46.0)
HEMOGLOBIN: 12.1 g/dL (ref 12.0–15.0)
MCH: 29.4 pg (ref 26.0–34.0)
MCHC: 33.4 g/dL (ref 30.0–36.0)
MCV: 88.1 fL (ref 78.0–100.0)
PLATELETS: 346 10*3/uL (ref 150–400)
RBC: 4.11 MIL/uL (ref 3.87–5.11)
RDW: 14.5 % (ref 11.5–15.5)
WBC: 5.6 10*3/uL (ref 4.0–10.5)

## 2016-09-03 LAB — HCG, QUANTITATIVE, PREGNANCY: HCG, BETA CHAIN, QUANT, S: 287 m[IU]/mL — AB (ref <5)

## 2016-09-03 NOTE — MAU Note (Signed)
PT  SAYS   SHE STARTED  VAG  BLEEDING  ON 5-23  ON A PAD-  AND  CAME  TO MAU.   WE GAVE  U/S , LABS,.     FOLLOW-UP  ON 6-1-   HAD U/S- -NO FHR. TOLD  TO RETURN  ON 6-11.     BUT  [place]  [place]-    STILL HAD BLEEDING- AND CLOTS AND BLEEDING HEAVIER .   STILL SAYS  SHE IS BLEEDING  AND  PASSING  CLOTS-   IN TRIAGE  -  SMALL AMT  LIGHT .       HAD  MILD  CRAMPS  ON 5-23   ,  WORSE [place] ,   BUT  LESS NOW .

## 2016-09-03 NOTE — Discharge Instructions (Signed)

## 2016-09-03 NOTE — MAU Provider Note (Signed)
Chief Complaint: Vaginal Bleeding   First Provider Initiated Contact with Patient 09/03/16 2224        SUBJECTIVE HPI: Tiffany Maldonado is a 46 y.o. [id number] at [redacted]w[redacted]d by LMP who presents to maternity admissions reporting Having heavy bleeding with passage of clots and tissue on 6/2-3, less now.  Cramps were also worse and are now much better.  Wants to see if this was a miscarriage.    She denies heavy vaginal bleeding, vaginal itching/burning, urinary symptoms, h/a, dizziness, n/v, or fever/chills.    Vaginal Bleeding  The patient's primary symptoms include vaginal bleeding. The patient's pertinent negatives include no genital itching, genital lesions, genital odor, pelvic pain or vaginal discharge. This is a recurrent problem. The current episode started in the past 7 days. The problem has been rapidly improving. The pain is mild. The problem affects both sides. Associated symptoms include abdominal pain (very mild intermittent cramps). Pertinent negatives include no chills, constipation, diarrhea, fever, headaches, nausea or vomiting. The vaginal discharge was bloody. The vaginal bleeding is lighter than menses. She has not been passing clots. She has not been passing tissue. Nothing aggravates the symptoms. She has tried nothing for the symptoms.    RN Note: PT  SAYS   SHE STARTED  VAG  BLEEDING  ON 5-23  ON A PAD-  AND  CAME  TO MAU.   WE GAVE  U/S , LABS,.     FOLLOW-UP  ON 6-1-   HAD U/S- -NO FHR. TOLD  TO RETURN  ON 6-11.     [place]    STILL HAD BLEEDING- AND CLOTS AND BLEEDING HEAVIER .   STILL SAYS  SHE IS BLEEDING  AND  PASSING  CLOTS-   IN TRIAGE  -  SMALL AMT  LIGHT .       HAD  MILD  CRAMPS  ON 5-23   ,  [place] ,   BUT  LESS NOW .    Electronically signed by Dionne Milo, RN at 6     Past Medical History:  Diagnosis Date  . Gestational diabetes    Past Surgical History:  Procedure Laterality Date  . BREAST REDUCTION SURGERY    . CESAREAN  SECTION    . SALPINGECTOMY  1998   Social History   Social History  . Marital status: Single    Spouse name: N/A  . Number of children: N/A  . Years of education: N/A   Occupational History  . Not on file.   Social History Main Topics  . Smoking status: Never Smoker  . Smokeless tobacco: Never Used  . Alcohol use No  . Drug use: No  . Sexual activity: Yes    Birth control/ protection: None   Other Topics Concern  . Not on file   Social History Narrative  . No narrative on file   No current facility-administered medications on file prior to encounter.    No current outpatient prescriptions on file prior to encounter.   Allergies  Allergen Reactions  . Latex Itching    I have reviewed patient's Past Medical Hx, Surgical Hx, Family Hx, Social Hx, medications and allergies.   ROS:  Review of Systems  Constitutional: Negative for chills and fever.  Gastrointestinal: Positive for abdominal pain (very mild intermittent cramps). Negative for constipation, diarrhea, nausea and vomiting.  Genitourinary: Positive for vaginal bleeding. Negative for pelvic pain and vaginal discharge.  Neurological: Negative for  headaches.   Review of Systems  Other systems negative   Physical Exam  Physical Exam Patient Vitals for the past 24 hrs:  BP Temp Temp src Pulse Resp Height Weight  09/03/16 1947 122/81 98.5 F (36.9 C) Oral 84 20 4\' 11"  (1.499 m) 175 lb 8 oz (79.6 kg)   Constitutional: Well-developed, well-nourished female in no acute distress.  Cardiovascular: normal rate and rhythm, no ectopy Respiratory: normal effort, lungs clear bilaterally GI: Abd soft, non-tender. Pos BS x 4 MS: Extremities nontender, no edema, normal ROM Neurologic: Alert and oriented x 4.  GU: Neg CVAT.  PELVIC EXAM: Vaginal bleeding is light                           Bimanual exam deferred secondary to very light bleeding and recent exam, will get [place] instead   LAB RESULTS Results for orders  placed or performed during the hospital encounter of 09/03/16 (from the past 24 hour(s))  CBC     Status: None   Collection Time: 09/03/16 10:08 PM  Result Value Ref Range   WBC 5.6 4.0 - 10.5 K/uL   RBC 4.11 3.87 - 5.11 MIL/uL   Hemoglobin 12.1 12.0 - 15.0 g/dL   HCT 40.9 81.1 - 91.4 %   MCV 88.1 78.0 - 100.0 fL   MCH 29.4 26.0 - 34.0 pg   MCHC 33.4 30.0 - 36.0 g/dL   RDW 78.2 95.6 - 21.3 %   Platelets 346 150 - 400 K/uL  hCG, quantitative, pregnancy     Status: Abnormal   Collection Time: 09/03/16 10:09 PM  Result Value Ref Range   hCG, Beta Chain, Quant, S 287 (H) <5 mIU/mL       IMAGING US Ob Comp Less 14 Wks  Result Date: 08/22/2016 CLINICAL DATA:  Spotting. Quantitative beta HCG greater than 10,000. By LMP patient is 8 weeks 6 days. By a LMP, EDC is 03/28/2017. EXAM: OBSTETRIC <14 WK [place] AND TRANSVAGINAL OB US TECHNIQUE: Both transabdominal and transvaginal ultrasound examinations were performed for complete evaluation of the gestation as well as the maternal uterus, adnexal regions, and pelvic cul-de-sac. Transvaginal technique was performed to assess early pregnancy. COMPARISON:  None applicable FINDINGS: Intrauterine gestational sac: Single Yolk sac:  Present Embryo:  Not seen Cardiac Activity: Not seen Heart Rate: None applicable  bpm MSD: 14.9  mm   6 w   2  d Subchorionic hemorrhage:  Small subchorionic hemorrhage. Maternal uterus/adnexae: Ovaries are normal in appearance. Right corpus luteum cyst is present. No free pelvic fluid. IMPRESSION: 1. Intrauterine gestational sac is present. Embryo is not yet visualized. 2. Follow-up ultrasound is recommended in 14 or more days to document presence of fetal pole and for dating purposes. 3. No adnexal mass identified. Electronically Signed   By: Norva Pavlov M.D.   On: 08/22/2016 15:36   US Ob Transvaginal  Result Date: 09/03/2016 CLINICAL DATA:  Bleeding and cramping EXAM: TRANSVAGINAL OB ULTRASOUND TECHNIQUE: Transvaginal  ultrasound was performed for complete evaluation of the gestation as well as the maternal uterus, adnexal regions, and pelvic cul-de-sac. COMPARISON:  08/30/2016 FINDINGS: Intrauterine gestational sac: Not visualized Yolk sac:  Not visualized Embryo:  Not visualized Maternal uterus/adnexae: Ovaries are within normal limits. The right ovary measures 1.7 x 3.4 x 2.1 cm. The left ovary measures 1.6 x 2.6 x 1.8 cm. No significant free fluid. IMPRESSION: The previously noted intrauterine gestational sac is no longer visualized and is  presumably evacuated. Endometrial stripe thickness of 8 mm with no apparent retained products of conception. Electronically Signed   By: Jasmine PangKim  Fujinaga M.D.   On: 09/03/2016 22:54   [place] Ob Transvaginal  Result Date: 08/30/2016 CLINICAL DATA:  Vaginal bleeding and cramping. First trimester pregnancy with inconclusive fetal viability. EXAM: TRANSVAGINAL OB ULTRASOUND TECHNIQUE: Transvaginal ultrasound was performed for complete evaluation of the gestation as well as the maternal uterus, adnexal regions, and pelvic cul-de-sac. COMPARISON:  08/22/2016 FINDINGS: Intrauterine gestational sac: Single; mildly irregular shape. Yolk sac:  Not Visualized. Embryo:  Visualized. Cardiac Activity: Not Visualized. CRL:   3  mm   5 w 5 d                  US EDC: 04/27/2017 Subchorionic hemorrhage:  Small subchorionic hemorrhage Maternal uterus/adnexae: Unremarkable appearance of right ovary. Nonvisualization of left ovary, however no mass or abnormal free fluid identified. IMPRESSION: Findings are suspicious but not yet definitive for failed pregnancy. Recommend follow-up US in 7 days for definitive diagnosis. This recommendation follows SRU consensus guidelines: Diagnostic Criteria for Nonviable Pregnancy Early in the First Trimester. Malva Limes Engl J Med 2013; 161:0960-45; 369:1443-51. Electronically Signed   By: Myles RosenthalJohn  Stahl M.D.   On: 08/30/2016 09:24   [place] Ob Transvaginal  Result Date: 08/22/2016 CLINICAL DATA:   Spotting. Quantitative beta HCG greater than 10,000. By LMP patient is 8 weeks 6 days. By a LMP, EDC is 03/28/2017. EXAM: OBSTETRIC <14 WK US AND TRANSVAGINAL OB US TECHNIQUE: Both transabdominal and transvaginal ultrasound examinations were performed for complete evaluation of the gestation as well as the maternal uterus, adnexal regions, and pelvic cul-de-sac. Transvaginal technique was performed to assess early pregnancy. COMPARISON:  None applicable FINDINGS: Intrauterine gestational sac: Single Yolk sac:  Present Embryo:  Not seen Cardiac Activity: Not seen Heart Rate: None applicable  bpm MSD: 14.9  mm   6 w   2  d Subchorionic hemorrhage:  Small subchorionic hemorrhage. Maternal uterus/adnexae: Ovaries are normal in appearance. Right corpus luteum cyst is present. No free pelvic fluid. IMPRESSION: 1. Intrauterine gestational sac is present. Embryo is not yet visualized. 2. Follow-up ultrasound is recommended in 14 or more days to document presence of fetal pole and for dating purposes. 3. No adnexal mass identified. Electronically Signed   By: Norva PavlovElizabeth  Brown M.D.   On: 08/22/2016 15:36    MAU Management/MDM:  US ordered to evaluate extent of bleeding and whether it had resulted in passage of pregnancy tissue US showed evacuation of previously seen uterine contents DIscussed results with patient who states she is relieved it is all over CBC checked since no baseline was checked. Hemoglobin is good DIscussed followup to discuss contraception  ASSESSMENT 1. Bleeding in early pregnancy   2.    Completed spontaneous abortion  PLAN Discharge home Message sent to clinic for post-SAB followup appointment Bleeding precautions  Pt stable at time of discharge. Encouraged to return here or to other Urgent Care/ED if she develops worsening of symptoms, increase in pain, fever, or other concerning symptoms.    Wynelle BourgeoisMarie Williams CNM, MSN Certified Nurse-Midwife 09/03/2016  10:29 PM

## 2016-09-10 ENCOUNTER — Ambulatory Visit (HOSPITAL_COMMUNITY): Payer: BLUE CROSS/BLUE SHIELD

## 2016-09-18 ENCOUNTER — Ambulatory Visit (HOSPITAL_COMMUNITY): Admission: RE | Admit: 2016-09-18 | Payer: BLUE CROSS/BLUE SHIELD | Source: Ambulatory Visit

## 2017-06-18 ENCOUNTER — Ambulatory Visit (HOSPITAL_COMMUNITY)
Admission: EM | Admit: 2017-06-18 | Discharge: 2017-06-18 | Disposition: A | Discharge status: Home / Self Care | Payer: Self-pay | Attending: Family Medicine | Admitting: Family Medicine

## 2017-06-18 ENCOUNTER — Encounter (HOSPITAL_COMMUNITY): Payer: Self-pay | Admitting: Emergency Medicine

## 2017-06-18 ENCOUNTER — Other Ambulatory Visit: Payer: Self-pay

## 2017-06-18 DIAGNOSIS — L659 Nonscarring hair loss, unspecified: Secondary | ICD-10-CM

## 2017-06-18 MED ORDER — FLUCONAZOLE 150 MG PO TABS: 150.0000 mg | ORAL_TABLET | ORAL | 0 refills | Status: DC

## 2017-06-18 MED ORDER — CLOTRIMAZOLE 1 % EX CREA: TOPICAL_CREAM | CUTANEOUS | 0 refills | Status: AC

## 2017-06-18 NOTE — ED Triage Notes (Signed)
Eyebrows are itching.  Eyebrows are burning.  Patient reports itchiness is spreading to face.  Onset 5 weeks ago.  Patient reports eyebrows are falling out.

## 2017-06-18 NOTE — ED Provider Notes (Signed)
[facility]    CSN: [id number] Arrival date & time: 06/18/17  1215     History   Chief Complaint Chief Complaint  Patient presents with  . Rash    HPI Tiffany Maldonado is a 47 y.o. female presenting today with concern for eyebrow itching and hair loss.  States that 5 weeks ago she had her eyebrows waxed and since she has had an itching and burning sensation.  She has used vinegar and Vicks vapor rub on her eyebrows for relief.  Over the past couple weeks she has noticed her eyebrows to start thinning and hair loss.  She is coming in today because she started to notice itching around the top of her neck and a red spot to her left eyebrow.  She had previously been to this same eyebrow placed before without issue.  Denies hair loss anywhere else.  HPI  Past Medical History:  Diagnosis Date  . Gestational diabetes     There are no active problems to display for this patient.   Past Surgical History:  Procedure Laterality Date  . BREAST REDUCTION SURGERY    . CESAREAN SECTION    . SALPINGECTOMY  1998    OB History    Gravida Para Term Preterm AB Living   8 5 5   2 5    SAB TAB Ectopic Multiple Live Births     1 1   5        Home Medications    Prior to Admission medications   Medication Sig Start Date End Date Taking? Authorizing Provider  clotrimazole (LOTRIMIN) 1 % cream Apply to affected area 2 times daily 06/18/17   Vaun Hyndman C, PA-C  fluconazole (DIFLUCAN) 150 MG tablet Take 1 tablet (150 mg total) by mouth once a week. For the next 5-6 weeks 06/18/17   Lew Dawes, PA-C    Family History Family History  Problem Relation Age of Onset  . Hypertension Mother   . Diabetes Father   . Diabetes Sister   . Cancer Maternal Aunt   . Cancer Maternal Uncle     Social History Social History   Tobacco Use  . Smoking status: Never Smoker  . Smokeless tobacco: Never Used  Substance Use Topics  . Alcohol use: No  . Drug use: No     Allergies    Latex   Review of Systems Review of Systems  Constitutional: Negative for fever.  Respiratory: Negative for shortness of breath.   Cardiovascular: Negative for chest pain.  Gastrointestinal: Negative for nausea and vomiting.  Musculoskeletal: Negative for myalgias.  Skin: Positive for rash.       Hair loss, pruritus  Neurological: Negative for dizziness, weakness, light-headedness and headaches.     Physical Exam Triage Vital Signs ED Triage Vitals [06/18/17 1343]  Enc Vitals Group     BP (!) 141/95     Pulse Rate 68     Resp 18     Temp 98.1 F (36.7 C)     Temp Source Oral     SpO2 100 %     Weight      Height      Head Circumference      Peak Flow      Pain Score      Pain Loc      Pain Edu?      Excl. in GC?    No data found.  Updated Vital Signs BP (!) 141/95 (BP Location: Left Arm)  Comment (BP Location): large cuff  Pulse 68   Temp 98.1 F (36.7 C) (Oral)   Resp 18   LMP 05/26/2017   SpO2 100%   Breastfeeding? Unknown   Visual Acuity Right Eye Distance:   Left Eye Distance:   Bilateral Distance:    Right Eye Near:   Left Eye Near:    Bilateral Near:     Physical Exam  Constitutional: She appears well-developed and well-nourished. No distress.  HENT:  Head: Normocephalic and atraumatic.  Eyes: Conjunctivae are normal.  Neck: Neck supple.  Cardiovascular: Normal rate.  Pulmonary/Chest: Effort normal. No respiratory distress.  Musculoskeletal: She exhibits no edema.  Neurological: She is alert.  Skin: Skin is warm and dry.  Thinning of eyebrows with loss of hair to lateral aspects.  Small red papule on left eyebrow, no underlying erythema or visible rash.  Eyelashes still intact  Psychiatric: She has a normal mood and affect.  Nursing note and vitals reviewed.    UC Treatments / Results  Labs (all labs ordered are listed, but only abnormal results are displayed) Labs Reviewed - No data to display  EKG  EKG Interpretation None         Radiology No results found.  Procedures Procedures (including critical care time)  Medications Ordered in UC Medications - No data to display   Initial Impression / Assessment and Plan / UC Course  I have reviewed the triage vital signs and the nursing notes.  Pertinent labs & imaging results that were available during my care of the patient were reviewed by me and considered in my medical decision making (see chart for details).     Patient was itching and eyebrow loss.  Patient requesting something to treat this as she is self-pay.  Discussed testing for thyroid, patient declined.  Her loss could also be due to stress, although patient denies any increased stress.  Her main concern is itching.  Discussed that it is unclear if a fungal treatment would work. Advised clotrimazole cream BID x 2-3 weeks. Requesting oral. Diflucan weekly.  Follow-up with dermatology if persisting.  Final Clinical Impressions(s) / UC Diagnoses   Final diagnoses:  Alopecia    ED Discharge Orders        Ordered    fluconazole (DIFLUCAN) 150 MG tablet  Weekly     06/18/17 1439    clotrimazole (LOTRIMIN) 1 % cream     06/18/17 1439       Controlled Substance Prescriptions [place] Controlled Substance Registry consulted? Not Applicable   Lew DawesWieters, Zaina Jenkin C, [place]-C 06/18/17 1449

## 2017-06-18 NOTE — Discharge Instructions (Signed)
Please take diflucan weekly for the next 5-6 weeks  I have sent in clotrimazole cream- you may also get this over the counter please get what is cheaper for you  You may also try a thin amount of hydrocortisone cream to help with itching.

## 2017-12-02 ENCOUNTER — Other Ambulatory Visit: Payer: Self-pay

## 2017-12-02 ENCOUNTER — Emergency Department (HOSPITAL_BASED_OUTPATIENT_CLINIC_OR_DEPARTMENT_OTHER)
Admission: EM | Admit: 2017-12-02 | Discharge: 2017-12-02 | Disposition: A | Discharge status: Home / Self Care | Payer: Medicaid Other | Attending: Emergency Medicine | Admitting: Emergency Medicine

## 2017-12-02 ENCOUNTER — Encounter (HOSPITAL_BASED_OUTPATIENT_CLINIC_OR_DEPARTMENT_OTHER): Payer: Self-pay | Admitting: *Deleted

## 2017-12-02 DIAGNOSIS — H00015 Hordeolum externum left lower eyelid: Secondary | ICD-10-CM | POA: Insufficient documentation

## 2017-12-02 DIAGNOSIS — Z91040 Latex allergy status: Secondary | ICD-10-CM | POA: Insufficient documentation

## 2017-12-02 MED ORDER — ERYTHROMYCIN 5 MG/GM OP OINT
TOPICAL_OINTMENT | Freq: Once | OPHTHALMIC | Status: AC
  Administered 2017-12-02: 1 via OPHTHALMIC
  Filled 2017-12-02: qty 3.5

## 2017-12-02 NOTE — ED Provider Notes (Signed)
[facility] EMERGENCY DEPARTMENT Provider Note   CSN: [id number] Arrival date & time: 12/02/17  1729     History   Chief Complaint Chief Complaint  Patient presents with  . Eye Problem    HPI Tiffany Maldonado is a 47 y.o. female.  Tiffany Maldonado is a 47 y.o. Female who is otherwise healthy, presents to the emergency department for evaluation of pain and swelling to the left lower eyelid.  Symptoms have been present and worsening for the past 4 days.  Patient reports she has had styes in the past and this feels similar.  Swelling primarily near the inner canthus and spreading across the lower lid, no involvement of the upper lid.  No pain in the eye, no eye redness or drainage.  No change in vision.  Patient reports she has been trying warm compresses without any relief has not tried anything else for her symptoms, in the past she is required antibiotic eye ointment.  No pain or symptoms over the right eye.  Fevers or chills.     Past Medical History:  Diagnosis Date  . Gestational diabetes     There are no active problems to display for this patient.   Past Surgical History:  Procedure Laterality Date  . BREAST REDUCTION SURGERY    . CESAREAN SECTION    . SALPINGECTOMY  1998     OB History    Gravida  8   Para  5   Term  5   Preterm      AB  2   Living  5     SAB      TAB  1   Ectopic  1   Multiple      Live Births  5            Home Medications    Prior to Admission medications   Medication Sig Start Date End Date Taking? Authorizing Provider  clotrimazole (LOTRIMIN) 1 % cream Apply to affected area 2 times daily 06/18/17   Wieters, Hallie C, PA-C  fluconazole (DIFLUCAN) 150 MG tablet Take 1 tablet (150 mg total) by mouth once a week. For the next 5-6 weeks 06/18/17   Lew Dawes, PA-C    Family History Family History  Problem Relation Age of Onset  . Hypertension Mother   . Diabetes Father   . Diabetes Sister   . Cancer  Maternal Aunt   . Cancer Maternal Uncle     Social History Social History   Tobacco Use  . Smoking status: Never Smoker  . Smokeless tobacco: Never Used  Substance Use Topics  . Alcohol use: No  . Drug use: No     Allergies   Latex   Review of Systems Review of Systems  Constitutional: Negative for chills and fever.  Eyes: Positive for pain. Negative for photophobia, discharge, redness, itching and visual disturbance.  Skin: Negative for color change and rash.     Physical Exam Updated Vital Signs BP (!) 141/90 (BP Location: Left Arm)   Pulse (!) 108   Temp 98.7 F (37.1 C) (Oral)   Resp 18   Ht 4' 11.5" (1.511 m)   Wt 78.5 kg   LMP 11/18/2017   SpO2 100%   BMI 34.36 kg/m   Physical Exam  Constitutional: She appears well-developed and well-nourished. No distress.  HENT:  Head: Normocephalic and atraumatic.  Mouth/Throat: Oropharynx is clear and moist.  Eyes: Right eye exhibits no discharge. Left eye exhibits  no discharge.  Erythema and swelling over the left lower eyelid, no expressible drainage, stye noted just adjacent to the inner canthus.  There is no erythema or scleral injection, no purulent discharge noted over the eye, extraocular eye movements are intact without pain, PERRLA and no consensual pain.  Eyelid without induration.  No swelling over the upper lid, no proptosis. right eye is unremarkable.  Neck: Neck supple.  Pulmonary/Chest: Effort normal. No respiratory distress.  Neurological: She is alert. Coordination normal.  Skin: Skin is warm and dry. She is not diaphoretic.  Psychiatric: She has a normal mood and affect. Her behavior is normal.  Nursing note and vitals reviewed.        ED Treatments / Results  Labs (all labs ordered are listed, but only abnormal results are displayed) Labs Reviewed - No data to display  EKG None  Radiology No results found.  Procedures Procedures (including critical care time)  Medications  Ordered in ED Medications  erythromycin ophthalmic ointment (1 application Right Eye Given 12/02/17 1826)     Initial Impression / Assessment and Plan / ED Course  I have reviewed the triage vital signs and the nursing notes.  Pertinent labs & imaging results that were available during my care of the patient were reviewed by me and considered in my medical decision making (see chart for details).  Physical exam with left lower lid erythema, tenderness and mild swelling without injection of the cornea or conjunctiva, consistent with external hordeolum.  No purulent discharge, corneal abrasions, entrapment, consensual photophobia,  No induration of the eyelid to suggest periorbital cellulitis. Presentation non-concerning for iritis, bacterial conjunctivitis, corneal abrasions, or HSV.   Will give erythromycin ointment for the right eye and discussed use of warm compresses. Personal hygiene and frequent handwashing also discussed.  Patient advised to followup with ophthalmologist if symptoms persist or worsen in any way including vision change or purulent discharge.    It has been determined that no acute conditions requiring further emergency intervention are present at this time. The patient/guardian have been advised of the diagnosis and plan. We have discussed signs and symptoms that warrant return to the ED, such as changes or worsening in symptoms.   Vital signs are stable at discharge.   BP (!) 141/90 (BP Location: Left Arm)   Pulse 97   Temp 98.7 F (37.1 C) (Oral)   Resp 16   Ht 4' 11.5" (1.511 m)   Wt 78.5 kg   LMP 11/18/2017   SpO2 100%   BMI 34.36 kg/m   Patient has voiced understanding and agreed to follow-up with the PCP or specialist.   Final Clinical Impressions(s) / ED Diagnoses   Final diagnoses:  Hordeolum externum of left lower eyelid    ED Discharge Orders    None       Bergan, [place], [place] 12/02/17 1857    Vanetta Mulders, MD 12/09/17 [phone]

## 2017-12-02 NOTE — Discharge Instructions (Signed)
Use erythromycin ointment 3-4 times a day as directed as well as warm compresses.  If stye is still not improving or you have worsening swelling around the eye, develop eye pain or drainage from the eye please follow-up with ophthalmology.

## 2017-12-02 NOTE — ED Triage Notes (Signed)
Pt c/o left eye stye x 4 days

## 2017-12-02 NOTE — ED Notes (Signed)
Pt/family verbalized understanding of discharge instructions.   

## 2020-04-08 ENCOUNTER — Ambulatory Visit: Payer: Medicaid Other | Attending: Internal Medicine

## 2020-04-08 DIAGNOSIS — Z23 Encounter for immunization: Secondary | ICD-10-CM

## 2020-04-08 NOTE — Progress Notes (Signed)
   Covid-19 Vaccination Clinic  Name:  Azilee Pirro    MRN: [id number] DOB: 02-19-1971  04/08/2020  Ms. Trefz was observed post Covid-19 immunization for 15 minutes without incident. She was provided with Vaccine Information Sheet and instruction to access the V-Safe system.   Ms. Bogie was instructed to call 911 with any severe reactions post vaccine: Marland Kitchen Difficulty breathing  . Swelling of face and throat  . A fast heartbeat  . A bad rash all over body  . Dizziness and weakness   Immunizations Administered    Name Date Dose VIS Date Route   Pfizer COVID-19 Vaccine 04/08/2020 11:30 AM 0.3 mL 01/19/2020 Intramuscular   Manufacturer: [employer], [employer]   Lot: [id number]   NDC: [address]-6

## 2020-11-13 ENCOUNTER — Other Ambulatory Visit: Payer: Self-pay | Admitting: Obstetrics and Gynecology

## 2020-11-13 DIAGNOSIS — R928 Other abnormal and inconclusive findings on diagnostic imaging of breast: Secondary | ICD-10-CM

## 2020-11-15 ENCOUNTER — Other Ambulatory Visit: Payer: Self-pay

## 2020-11-15 ENCOUNTER — Ambulatory Visit
Admission: RE | Admit: 2020-11-15 | Discharge: 2020-11-15 | Disposition: A | Discharge status: Home / Self Care | Payer: Medicaid Other | Source: Ambulatory Visit | Attending: Obstetrics and Gynecology | Admitting: Obstetrics and Gynecology

## 2020-11-15 ENCOUNTER — Ambulatory Visit
Admission: RE | Admit: 2020-11-15 | Discharge: 2020-11-15 | Disposition: A | Discharge status: Home / Self Care | Payer: Managed Care, Other (non HMO) | Source: Ambulatory Visit | Attending: Obstetrics and Gynecology | Admitting: Obstetrics and Gynecology

## 2020-11-15 DIAGNOSIS — R928 Other abnormal and inconclusive findings on diagnostic imaging of breast: Secondary | ICD-10-CM

## 2021-02-09 ENCOUNTER — Encounter: Payer: Self-pay | Admitting: Internal Medicine

## 2021-04-03 ENCOUNTER — Ambulatory Visit (AMBULATORY_SURGERY_CENTER): Payer: Managed Care, Other (non HMO) | Admitting: *Deleted

## 2021-04-03 ENCOUNTER — Other Ambulatory Visit: Payer: Self-pay

## 2021-04-03 VITALS — Ht 59.0 in | Wt 175.0 lb

## 2021-04-03 DIAGNOSIS — Z1211 Encounter for screening for malignant neoplasm of colon: Secondary | ICD-10-CM

## 2021-04-03 MED ORDER — NA SULFATE-K SULFATE-MG SULF 17.5-3.13-1.6 GM/177ML PO SOLN: 1.0000 | Freq: Once | ORAL | 0 refills | Status: AC

## 2021-04-03 NOTE — Progress Notes (Signed)
No egg or soy allergy known to patient  °No issues known to pt with past sedation with any surgeries or procedures °Patient denies ever being told they had issues or difficulty with intubation  °No FH of Malignant Hyperthermia °Pt is not on diet pills °Pt is not on  home 02  °Pt is not on blood thinners  °Pt denies issues with constipation  °No A fib or A flutter ° °Pt is fully vaccinated  for Covid  °Discussed with pt there will be an out-of-pocket cost for prep and that varies from $0 to 70 +  dollars - pt verbalized understanding  ° °Due to the COVID-19 pandemic we are asking patients to follow certain guidelines in [facility] and the [facility]   °Pt aware of COVID protocols and [facility] guidelines   °

## 2021-04-17 ENCOUNTER — Encounter: Payer: Managed Care, Other (non HMO) | Admitting: Internal Medicine

## 2021-05-15 ENCOUNTER — Encounter: Payer: Self-pay | Admitting: Internal Medicine

## 2021-05-16 ENCOUNTER — Ambulatory Visit (AMBULATORY_SURGERY_CENTER): Payer: Managed Care, Other (non HMO) | Admitting: Internal Medicine

## 2021-05-16 ENCOUNTER — Other Ambulatory Visit: Payer: Self-pay

## 2021-05-16 ENCOUNTER — Encounter: Payer: Self-pay | Admitting: Internal Medicine

## 2021-05-16 VITALS — BP 129/79 | HR 58 | Temp 97.5°F | Resp 12 | Ht 59.0 in | Wt 175.0 lb

## 2021-05-16 DIAGNOSIS — Z1211 Encounter for screening for malignant neoplasm of colon: Secondary | ICD-10-CM

## 2021-05-16 MED ORDER — SODIUM CHLORIDE 0.9 % IV SOLN: 500.0000 mL | Freq: Once | INTRAVENOUS | Status: DC

## 2021-05-16 NOTE — Progress Notes (Signed)
VS-CW  Pt's states no medical or surgical changes since previsit or office visit.  

## 2021-05-16 NOTE — Op Note (Signed)
[facility] Patient Name: Tiffany Maldonado Procedure Date: 05/16/2021 11:05 AM MRN: [id number] Endoscopist: Nicole Kindred "Eulah Pont ,  Age: 51 Referring MD:  Date of Birth: 09-02-1970 Gender: Female Account #: [id number] Procedure:                Colonoscopy Indications:              Screening for colorectal malignant neoplasm, This                            is the patient's first colonoscopy Medicines:                Monitored Anesthesia Care Procedure:                Pre-Anesthesia Assessment:                           - Prior to the procedure, a History and Physical                            was performed, and patient medications and                            allergies were reviewed. The patient's tolerance of                            previous anesthesia was also reviewed. The risks                            and benefits of the procedure and the sedation                            options and risks were discussed with the patient.                            All questions were answered, and informed consent                            was obtained. Prior Anticoagulants: The patient has                            taken no previous anticoagulant or antiplatelet                            agents. ASA Grade Assessment: I - A normal, healthy                            patient. After reviewing the risks and benefits,                            the patient was deemed in satisfactory condition to                            undergo the procedure.  After obtaining informed consent, the colonoscope                            was passed under direct vision. Throughout the                            procedure, the patient's blood pressure, pulse, and                            oxygen saturations were monitored continuously. The                            Olympus CF-HQ190L [id number]) Colonoscope was                            introduced through the anus and  advanced to the the                            terminal ileum. The colonoscopy was performed                            without difficulty. The patient tolerated the                            procedure well. The quality of the bowel                            preparation was excellent. The terminal ileum,                            ileocecal valve, appendiceal orifice, and rectum                            were photographed. Scope In: 11:09:53 AM Scope Out: 11:21:59 AM Scope Withdrawal Time: 0 hours 7 minutes 33 seconds  Total Procedure Duration: 0 hours 12 minutes 6 seconds  Findings:                 The terminal ileum appeared normal.                           Non-bleeding internal hemorrhoids were found during                            retroflexion.                           The exam was otherwise without abnormality. Complications:            No immediate complications. Estimated Blood Loss:     Estimated blood loss: none. Impression:               - The examined portion of the ileum was normal.                           - Non-bleeding internal hemorrhoids.                           -  The examination was otherwise normal.                           - No specimens collected. Recommendation:           - Discharge patient to home (with escort).                           - Repeat colonoscopy in 10 years for screening                            purposes.                           - The findings and recommendations were discussed                            with the patient. Nicole Kindred "Eulah Pont,  05/16/2021 11:25:06 AM

## 2021-05-16 NOTE — Progress Notes (Signed)
GASTROENTEROLOGY PROCEDURE H&P NOTE   Primary Care Physician: Hermine Messick, MD    Reason for Procedure:   Colon cancer screening  Plan:    Colonoscopy  Patient is appropriate for endoscopic procedure(s) in the ambulatory ([place]) setting.  The nature of the procedure, as well as the risks, benefits, and alternatives were carefully and thoroughly reviewed with the patient. Ample time for discussion and questions allowed. The patient understood, was satisfied, and agreed to proceed.     HPI: Tiffany Maldonado is a 51 y.o. female who presents for colonoscopy for colon cancer screening. Denies blood in stools, changes in bowel habits, weight loss. Denies fam hx of colon cancer.  Past Medical History:  Diagnosis Date   Anxiety    Gestational diabetes    Heart murmur    as a child    Past Surgical History:  Procedure Laterality Date   BREAST REDUCTION SURGERY     CESAREAN SECTION     SALPINGECTOMY  1998    Prior to Admission medications   Medication Sig Start Date End Date Taking? Authorizing Provider  clotrimazole (LOTRIMIN) 1 % cream Apply to affected area 2 times daily Patient not taking: Reported on 04/03/2021 06/18/17   Wieters, Hallie C, PA-C  escitalopram (LEXAPRO) 10 MG tablet Take 10 mg by mouth daily. 11/19/20   [provider]  fluconazole (DIFLUCAN) 150 MG tablet Take 1 tablet (150 mg total) by mouth once a week. For the next 5-6 weeks Patient not taking: Reported on 04/03/2021 06/18/17   Janith Lima, PA-C    Current Outpatient Medications  Medication Sig Dispense Refill   clotrimazole (LOTRIMIN) 1 % cream Apply to affected area 2 times daily (Patient not taking: Reported on 04/03/2021) 15 g 0   escitalopram (LEXAPRO) 10 MG tablet Take 10 mg by mouth daily.     fluconazole (DIFLUCAN) 150 MG tablet Take 1 tablet (150 mg total) by mouth once a week. For the next 5-6 weeks (Patient not taking: Reported on 04/03/2021) 6 tablet 0   No current  facility-administered medications for this visit.    Allergies as of 05/16/2021 - Review Complete 05/16/2021  Allergen Reaction Noted   Latex Itching 09/25/2010    Family History  Problem Relation Age of Onset   Hypertension Mother    Diabetes Father    Diabetes Sister    Breast cancer Maternal Aunt    Cancer Maternal Aunt    Cancer Maternal Uncle    Esophageal cancer Neg Hx    Colon cancer Neg Hx     Social History   Socioeconomic History   Marital status: Single    Spouse name: Not on file   Number of children: Not on file   Years of education: Not on file   Highest education level: Not on file  Occupational History   Not on file  Tobacco Use   Smoking status: Never   Smokeless tobacco: Never  Vaping Use   Vaping Use: Never used  Substance and Sexual Activity   Alcohol use: No    Comment: occ   Drug use: No   Sexual activity: Yes    Birth control/protection: None  Other Topics Concern   Not on file  Social History Narrative   Not on file   Social Determinants of Health   Financial Resource Strain: Not on file  Food Insecurity: Not on file  Transportation Needs: Not on file  Physical Activity: Not on file  Stress: Not on file  Social Connections: Not on file  Intimate Partner Violence: Not on file    Physical Exam: Vital signs in last 24 hours: There were no vitals taken for this visit. GEN: NAD EYE: Sclerae anicteric ENT: MMM CV: Non-tachycardic Pulm: No increased work of breathing GI: Soft, NT/ND NEURO:  Alert & Oriented   Christia Reading, MD [facility]  05/16/2021 10:30 AM

## 2021-05-16 NOTE — Progress Notes (Signed)
Report given to PACU, vss 

## 2021-05-16 NOTE — Patient Instructions (Signed)
? ?  Handout on hemorrhoids given to you today ? ? ? ?YOU HAD AN ENDOSCOPIC PROCEDURE TODAY AT THE [facility]:   Refer to the procedure report that was given to you for any specific questions about what was found during the examination.  If the procedure report does not answer your questions, please call your gastroenterologist to clarify.  If you requested that your care partner not be given the details of your procedure findings, then the procedure report has been included in a sealed envelope for you to review at your convenience later. ? ?YOU SHOULD EXPECT: Some feelings of bloating in the abdomen. Passage of more gas than usual.  Walking can help get rid of the air that was put into your GI tract during the procedure and reduce the bloating. If you had a lower endoscopy (such as a colonoscopy or flexible sigmoidoscopy) you may notice spotting of blood in your stool or on the toilet paper. If you underwent a bowel prep for your procedure, you may not have a normal bowel movement for a few days. ? ?Please Note:  You might notice some irritation and congestion in your nose or some drainage.  This is from the oxygen used during your procedure.  There is no need for concern and it should clear up in a day or so. ? ?SYMPTOMS TO REPORT IMMEDIATELY: ? ?Following lower endoscopy (colonoscopy or flexible sigmoidoscopy): ? Excessive amounts of blood in the stool ? Significant tenderness or worsening of abdominal pains ? Swelling of the abdomen that is new, acute ? Fever of 100?F or higher ? ?For urgent or emergent issues, a gastroenterologist can be reached at any hour by calling [phone]. ?Do not use MyChart messaging for urgent concerns.  ? ? ?DIET:  We do recommend a small meal at first, but then you may proceed to your regular diet.  Drink plenty of fluids but you should avoid alcoholic beverages for 24 hours. ? ?ACTIVITY:  You should plan to take it easy for the rest of today and you should NOT  DRIVE or use heavy machinery until tomorrow (because of the sedation medicines used during the test).   ? ?FOLLOW UP: ?Our staff will call the number listed on your records 48-72 hours following your procedure to check on you and address any questions or concerns that you may have regarding the information given to you following your procedure. If we do not reach you, we will leave a message.  We will attempt to reach you two times.  During this call, we will ask if you have developed any symptoms of COVID 19. If you develop any symptoms (ie: fever, flu-like symptoms, shortness of breath, cough etc.) before then, please call [phone].  If you test positive for Covid 19 in the 2 weeks post procedure, please call and report this information to us.   ? ?If any biopsies were taken you will be contacted by phone or by letter within the next 1-3 weeks.  Please call us at [phone] if you have not heard about the biopsies in 3 weeks.  ? ? ?SIGNATURES/CONFIDENTIALITY: ?You and/or your care partner have signed paperwork which will be entered into your electronic medical record.  These signatures attest to the fact that that the information above on your After Visit Summary has been reviewed and is understood.  Full responsibility of the confidentiality of this discharge information lies with you and/or your care-partner.  ?

## 2021-05-18 ENCOUNTER — Telehealth: Payer: Self-pay

## 2021-05-18 NOTE — Telephone Encounter (Signed)
°  Follow up Call-  Call back number 05/16/2021  Post procedure Call Back phone  # [phone]  Permission to leave phone message Yes  Some recent data might be hidden     Patient questions:  Do you have a fever, pain , or abdominal swelling? No. Pain Score  0 *  Have you tolerated food without any problems? Yes.    Have you been able to return to your normal activities? Yes.    Do you have any questions about your discharge instructions: Diet   No. Medications  No. Follow up visit  No.  Do you have questions or concerns about your Care? No.  Actions: * If pain score is 4 or above: No action needed, pain <4.

## 2022-04-09 IMAGING — MG MM DIGITAL DIAGNOSTIC UNILAT*L* W/ TOMO W/ CAD
4 series · 4 of 12 positions shown · non-contrast
Comparison: 11/01/2020, and studies from 6070

CLINICAL DATA: Patient returns after screening study for evaluation
possible LEFT breast asymmetry.

EXAM:
DIGITAL DIAGNOSTIC UNILATERAL LEFT MAMMOGRAM WITH TOMOSYNTHESIS AND
CAD; ULTRASOUND LEFT BREAST LIMITED
TECHNIQUE: Left digital diagnostic mammography and breast tomosynthesis was
performed. The images were evaluated with computer-aided detection.;
Targeted ultrasound examination of the left breast was performed.

[L MLO synth-2D]
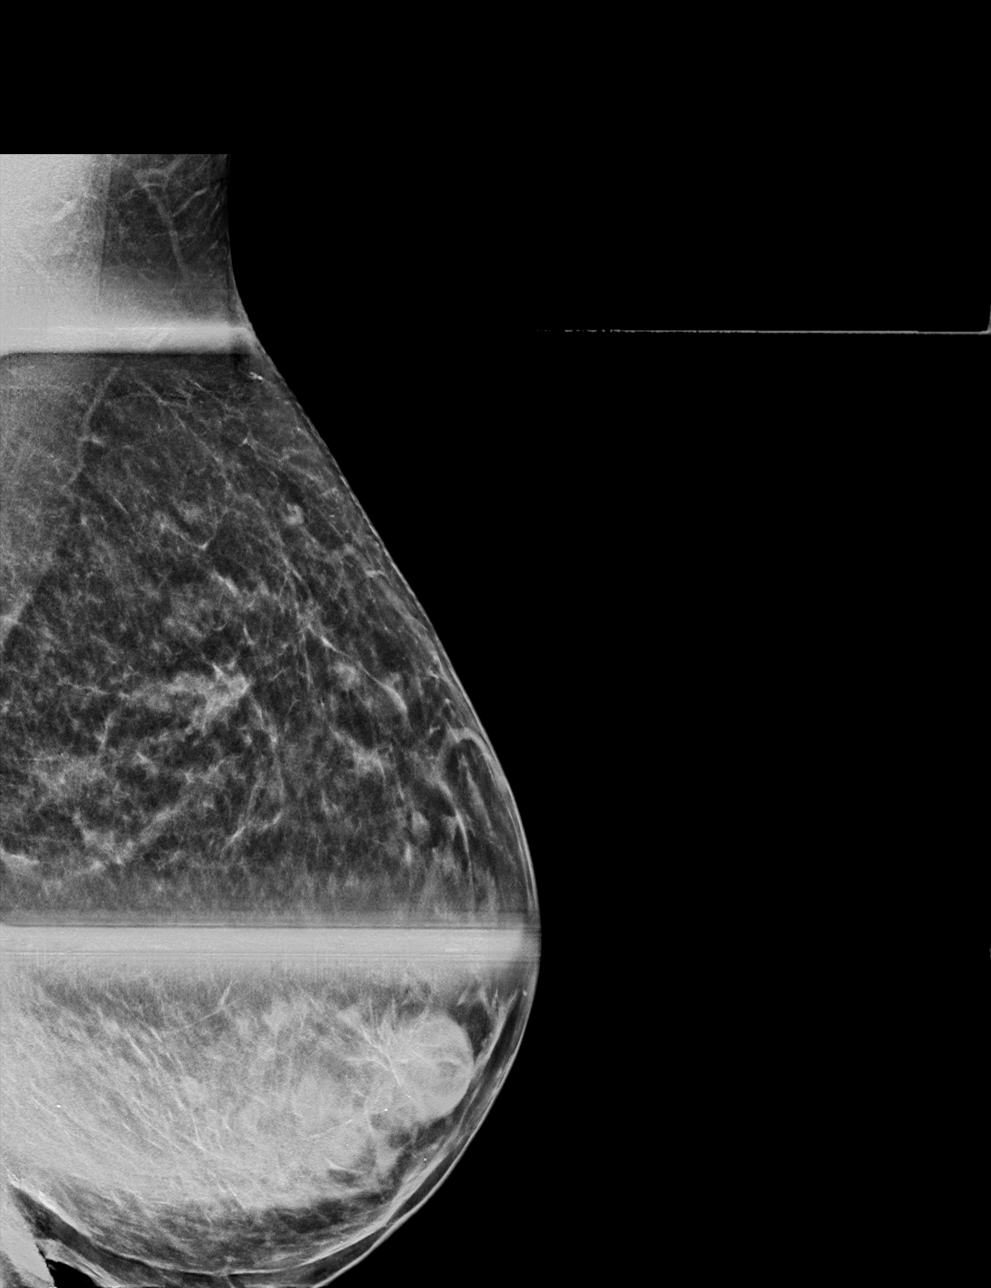

[L CC synth-2D]
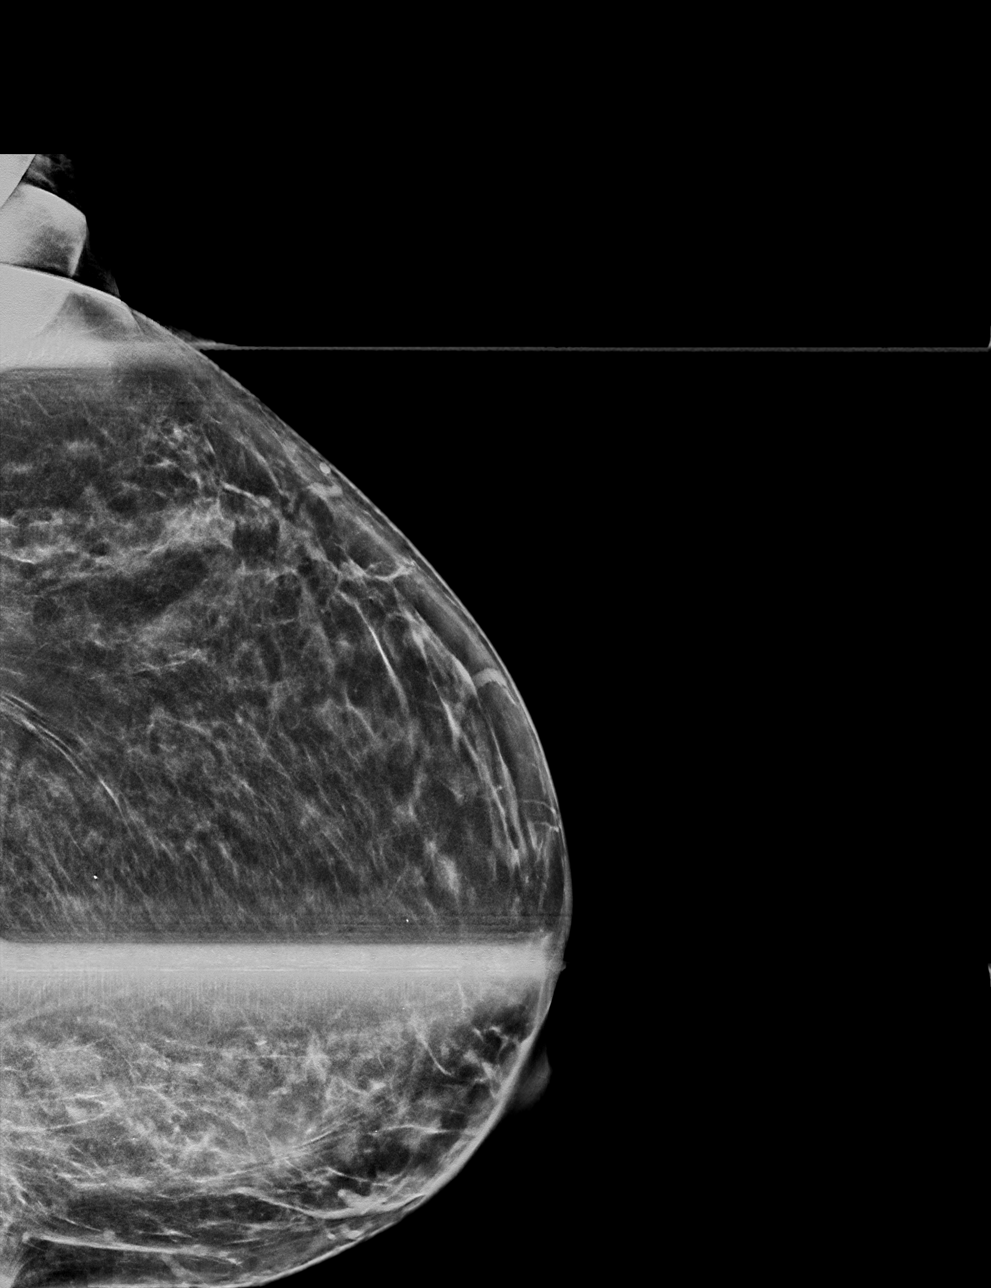

[L MLO tomo · tomo slice 27/54.0]
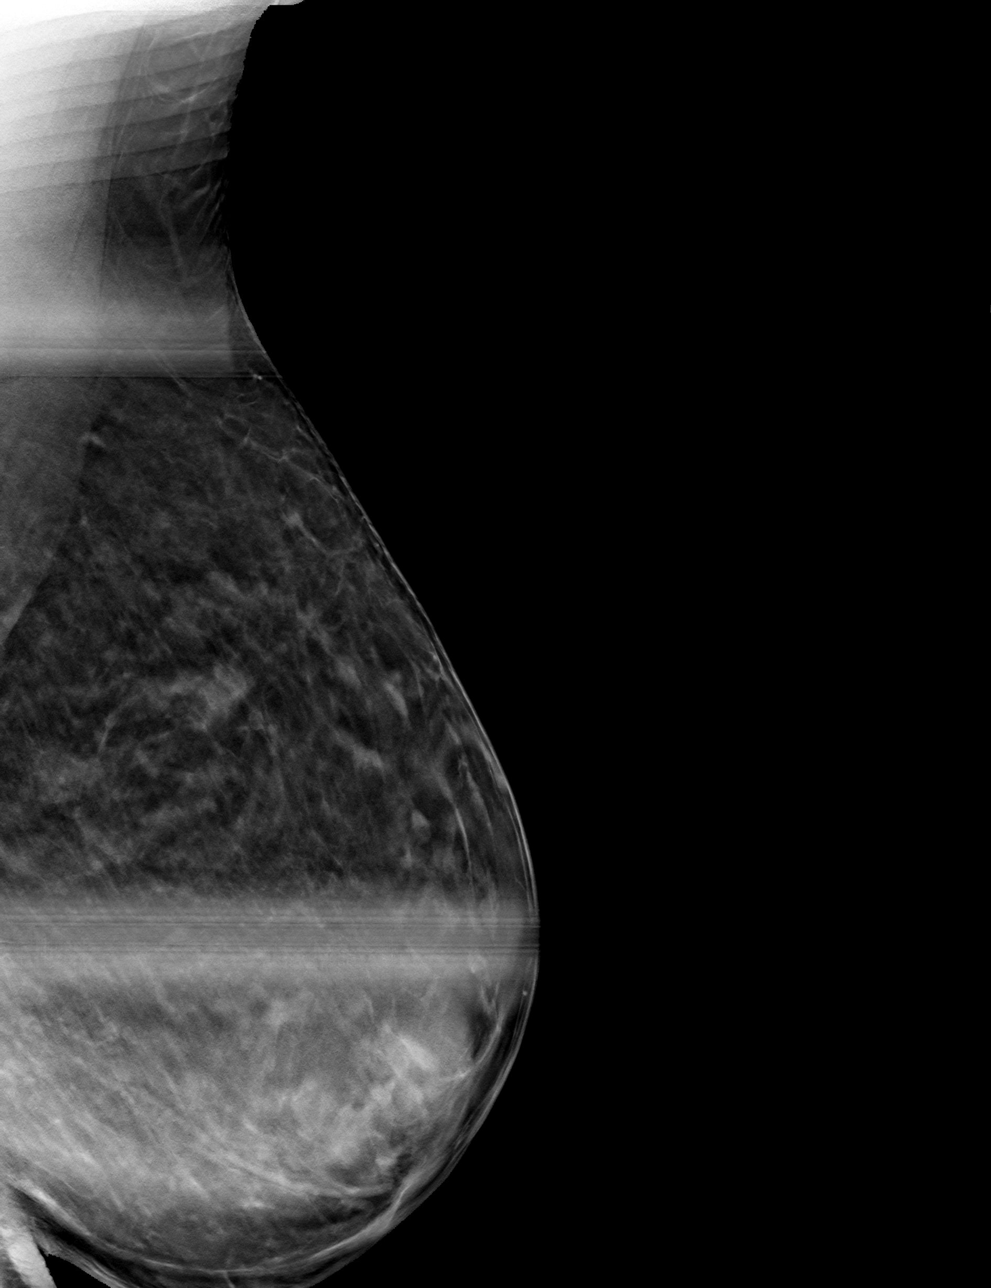

[L CC tomo · tomo slice 28/55.0]
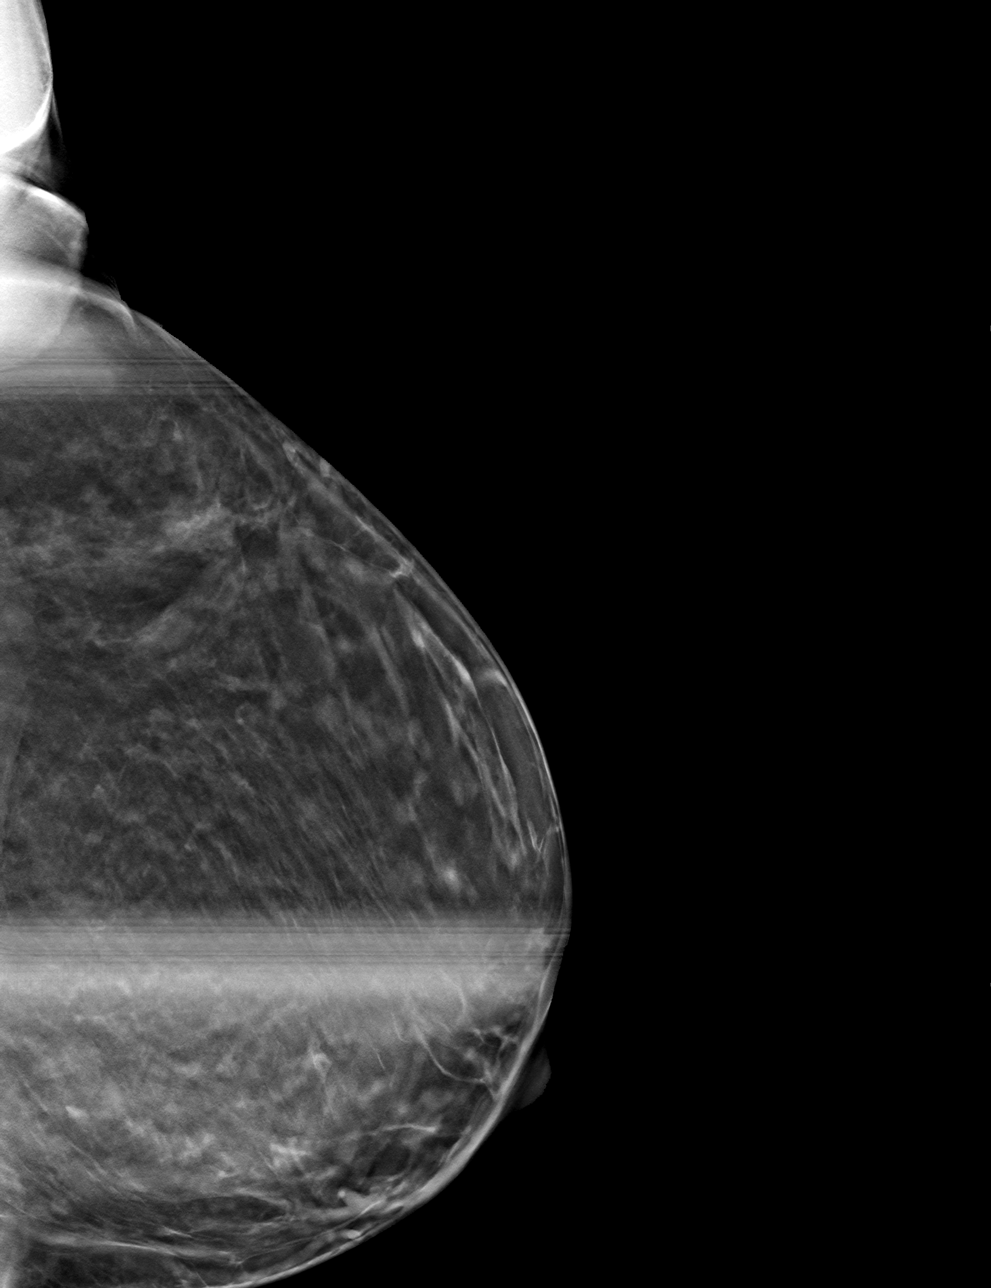

[4 of 12 positions shown; findings below may reference images not displayed]

ACR Breast Density Category b: There are scattered areas of
fibroglandular density.
FINDINGS: Additional 2-D and 3-D images are performed. These views show no
persistent mass in the LATERAL portion of the LEFT breast. Asymmetry
in the LATERAL portion of the LEFT breast appears stable compared
with previous exams.

On physical exam, there is a well-healed reduction scar in the LOWER
OUTER QUADRANT of the LEFT breast.

Targeted ultrasound is performed, showing normal appearing
fibroglandular tissue in the LATERAL aspect of the LEFT breast. No
suspicious mass, distortion, or acoustic shadowing is demonstrated
with ultrasound.
IMPRESSION: Stable appearance of asymmetric density in the LATERAL portion of
the LEFT breast, likely related to previous reduction surgery.

No mammographic or ultrasound evidence for malignancy.

RECOMMENDATION:
Screening mammogram in one year.(Code:[id number]-G-I0D)

I have discussed the findings and recommendations with the patient.
If applicable, a reminder letter will be sent to the patient
regarding the next appointment.

BI-RADS CATEGORY  1: Negative.

## 2022-12-30 ENCOUNTER — Encounter (HOSPITAL_COMMUNITY): Payer: Self-pay | Admitting: Emergency Medicine

## 2022-12-30 ENCOUNTER — Ambulatory Visit (HOSPITAL_COMMUNITY)
Admission: EM | Admit: 2022-12-30 | Discharge: 2022-12-30 | Disposition: A | Discharge status: Home / Self Care | Payer: Managed Care, Other (non HMO) | Attending: Physician Assistant | Admitting: Physician Assistant

## 2022-12-30 DIAGNOSIS — T63481A Toxic effect of venom of other arthropod, accidental (unintentional), initial encounter: Secondary | ICD-10-CM

## 2022-12-30 DIAGNOSIS — Z23 Encounter for immunization: Secondary | ICD-10-CM

## 2022-12-30 DIAGNOSIS — L500 Allergic urticaria: Secondary | ICD-10-CM | POA: Diagnosis not present

## 2022-12-30 MED ORDER — TETANUS-DIPHTH-ACELL PERTUSSIS 5-2.5-18.5 LF-MCG/0.5 IM SUSY
PREFILLED_SYRINGE | INTRAMUSCULAR | Status: AC
  Filled 2022-12-30: qty 0.5

## 2022-12-30 MED ORDER — TETANUS-DIPHTH-ACELL PERTUSSIS 5-2.5-18.5 LF-MCG/0.5 IM SUSY
0.5000 mL | PREFILLED_SYRINGE | Freq: Once | INTRAMUSCULAR | Status: AC
  Administered 2022-12-30: 0.5 mL via INTRAMUSCULAR

## 2022-12-30 MED ORDER — CETIRIZINE HCL 10 MG PO TABS: 10.0000 mg | ORAL_TABLET | Freq: Every day | ORAL | 0 refills | Status: AC

## 2022-12-30 MED ORDER — FAMOTIDINE 20 MG PO TABS: 20.0000 mg | ORAL_TABLET | Freq: Every day | ORAL | 0 refills | Status: AC

## 2022-12-30 MED ORDER — PREDNISONE 20 MG PO TABS: 40.0000 mg | ORAL_TABLET | Freq: Every day | ORAL | 0 refills | Status: AC

## 2022-12-30 NOTE — ED Provider Notes (Signed)
[facility]    CSN: [id number] Arrival date & time: 12/30/22  1234      History   Chief Complaint No chief complaint on file.   HPI Tiffany Maldonado is a 52 y.o. female.   Patient presents today with a 1 day history of erythematous lesion following an insect sting.  Reports that she was stung by yellow jacket yesterday and has developed redness, pain, itching surrounding the site of the sting.  She is noted prednisone before and denies any history of significant environmental allergies.  She denies history of anaphylaxis.  Denies any swelling of her throat, shortness of breath, muffled voice.  Denies any nausea, vomiting, diarrhea.  She has not tried any over-the-counter medication for symptom management.  She is unsure when her last tetanus was.  In her record the last 1 was in 2013.  She denies any recent antibiotics or steroids.  Denies history of diabetes.  She does not believe that she could be pregnant as her last menstrual cycle was 12/04/2022 and regular.    Past Medical History:  Diagnosis Date   Anxiety    Gestational diabetes    Heart murmur    as a child    There are no problems to display for this patient.   Past Surgical History:  Procedure Laterality Date   BREAST REDUCTION SURGERY     CESAREAN SECTION     SALPINGECTOMY  1998    OB History     Gravida  8   Para  5   Term  5   Preterm      AB  2   Living  5      SAB      IAB  1   Ectopic  1   Multiple      Live Births  5            Home Medications    Prior to Admission medications   Medication Sig Start Date End Date Taking? Authorizing Provider  cetirizine (ZYRTEC ALLERGY) 10 MG tablet Take 1 tablet (10 mg total) by mouth daily. 12/30/22  Yes Lj Miyamoto K, PA-C  famotidine (PEPCID) 20 MG tablet Take 1 tablet (20 mg total) by mouth daily. 12/30/22  Yes Kiely Cousar K, PA-C  predniSONE (DELTASONE) 20 MG tablet Take 2 tablets (40 mg total) by mouth daily for 5 days.  12/30/22 01/04/23 Yes Romani Wilbon, Noberto Retort, PA-C  clotrimazole (LOTRIMIN) 1 % cream Apply to affected area 2 times daily Patient not taking: Reported on 04/03/2021 06/18/17   Wieters, Hallie C, PA-C  escitalopram (LEXAPRO) 10 MG tablet Take 10 mg by mouth daily. Patient not taking: Reported on 05/16/2021 11/19/20   [provider]    Family History Family History  Problem Relation Age of Onset   Hypertension Mother    Diabetes Father    Diabetes Sister    Breast cancer Maternal Aunt    Cancer Maternal Aunt    Cancer Maternal Uncle    Esophageal cancer Neg Hx    Colon cancer Neg Hx    Rectal cancer Neg Hx    Stomach cancer Neg Hx     Social History Social History   Tobacco Use   Smoking status: Never   Smokeless tobacco: Never  Vaping Use   Vaping status: Never Used  Substance Use Topics   Alcohol use: No    Comment: occ   Drug use: No     Allergies   Latex  Review of Systems Review of Systems  Constitutional:  Positive for activity change. Negative for appetite change, fatigue and fever.  HENT:  Negative for sore throat, trouble swallowing and voice change.   Gastrointestinal:  Negative for abdominal pain, diarrhea, nausea and vomiting.  Musculoskeletal:  Negative for arthralgias and myalgias.  Skin:  Positive for color change and wound.     Physical Exam Triage Vital Signs ED Triage Vitals  Encounter Vitals Group     BP 12/30/22 1312 136/82     Systolic BP Percentile --      Diastolic BP Percentile --      Pulse Rate 12/30/22 1310 68     Resp 12/30/22 1310 16     Temp 12/30/22 1312 99 F (37.2 C)     Temp Source 12/30/22 1312 Oral     SpO2 12/30/22 1310 97 %     Weight --      Height --      Head Circumference --      Peak Flow --      Pain Score 12/30/22 1308 7     Pain Loc --      Pain Education --      Exclude from Growth Chart --    No data found.  Updated Vital Signs BP 136/82 (BP Location: Left Arm)   Pulse 68   Temp 99 F (37.2 C)  (Oral)   Resp 16   LMP 12/04/2022 (Approximate)   SpO2 97%   Visual Acuity Right Eye Distance:   Left Eye Distance:   Bilateral Distance:    Right Eye Near:   Left Eye Near:    Bilateral Near:     Physical Exam Vitals reviewed.  Constitutional:      General: She is awake. She is not in acute distress.    Appearance: Normal appearance. She is well-developed. She is not ill-appearing.     Comments: Very pleasant female appears stated age in no acute distress sitting comfortably in exam room  HENT:     Head: Normocephalic and atraumatic.     Mouth/Throat:     Pharynx: Uvula midline. No oropharyngeal exudate or posterior oropharyngeal erythema.     Comments: Normal-appearing posterior pharynx Cardiovascular:     Rate and Rhythm: Normal rate and regular rhythm.     Heart sounds: Normal heart sounds, S1 normal and S2 normal. No murmur heard. Pulmonary:     Effort: Pulmonary effort is normal.     Breath sounds: Normal breath sounds. No wheezing, rhonchi or rales.  Musculoskeletal:     Right lower leg: No edema.     Left lower leg: No edema.  Skin:    Comments: 14 x 10 cm urticarial lesion noted medial left knee.  No streaking evidence of lymphangitis.  Psychiatric:        Behavior: Behavior is cooperative.      UC Treatments / Results  Labs (all labs ordered are listed, but only abnormal results are displayed) Labs Reviewed - No data to display  EKG   Radiology No results found.  Procedures Procedures (including critical care time)  Medications Ordered in UC Medications  Tdap (BOOSTRIX) injection 0.5 mL (has no administration in time range)    Initial Impression / Assessment and Plan / UC Course  I have reviewed the triage vital signs and the nursing notes.  Pertinent labs & imaging results that were available during my care of the patient were reviewed by me and considered in my  medical decision making (see chart for details).     Patient is  well-appearing, afebrile, nontoxic, nontachycardic.  Suspect localized allergic reaction given clinical presentation.  We will start H1 and H2 blockade as well as a short burst of prednisone.  Discussed that she is not to take NSAIDs with prednisone due to risk of GI bleeding.  Tetanus was updated today.  We discussed that she is to demarcate the area of erythema and contact us if this spread significantly.  We discussed that if she has any alarm symptoms including fever, increasing erythema or significant enlargement of lesion, fever, nausea, vomiting she needs to be seen immediately.  Strict return precautions given.  Work excuse note provided.  Final Clinical Impressions(s) / UC Diagnoses   Final diagnoses:  Allergic urticaria  Insect stings, accidental or unintentional, initial encounter     Discharge Instructions      We are treating you for a localized allergic reaction to the bee sting.  Start Pepcid daily and Zyrtec daily.  Take prednisone 40 mg for 5 days.  Do not take NSAIDs with this medication including aspirin, ibuprofen/Advil, naproxen/Aleve.  We updated your tetanus today.  Draw a circle around the area of redness and if this continues to spread or if anything changes and you develop fever, nausea, vomiting, increasing pain you need to be seen immediately.  Follow-up with your primary care.     ED Prescriptions     Medication Sig Dispense Auth. Provider   cetirizine (ZYRTEC ALLERGY) 10 MG tablet Take 1 tablet (10 mg total) by mouth daily. 30 tablet Nusrat Encarnacion K, PA-C   famotidine (PEPCID) 20 MG tablet Take 1 tablet (20 mg total) by mouth daily. 30 tablet Juliet Vasbinder K, PA-C   predniSONE (DELTASONE) 20 MG tablet Take 2 tablets (40 mg total) by mouth daily for 5 days. 10 tablet Jeweldean Drohan, Noberto Retort, PA-C      PDMP not reviewed this encounter.   Jeani Hawking, PA-C 12/30/22 1339

## 2022-12-30 NOTE — ED Triage Notes (Signed)
Pt got stung by yellow jacket yesterday morning on left knee. Pt states it first time she has been stung and site is swollen, red, and painful

## 2022-12-30 NOTE — Discharge Instructions (Signed)
We are treating you for a localized allergic reaction to the bee sting.  Start Pepcid daily and Zyrtec daily.  Take prednisone 40 mg for 5 days.  Do not take NSAIDs with this medication including aspirin, ibuprofen/Advil, naproxen/Aleve.  We updated your tetanus today.  Draw a circle around the area of redness and if this continues to spread or if anything changes and you develop fever, nausea, vomiting, increasing pain you need to be seen immediately.  Follow-up with your primary care.

## 2024-02-11 ENCOUNTER — Other Ambulatory Visit: Payer: Self-pay | Admitting: Obstetrics and Gynecology

## 2024-02-11 DIAGNOSIS — Z1231 Encounter for screening mammogram for malignant neoplasm of breast: Secondary | ICD-10-CM

## 2024-03-11 ENCOUNTER — Ambulatory Visit
Admission: RE | Admit: 2024-03-11 | Discharge: 2024-03-11 | Disposition: A | Discharge status: Home / Self Care | Source: Ambulatory Visit | Attending: Obstetrics and Gynecology | Admitting: Obstetrics and Gynecology

## 2024-03-11 DIAGNOSIS — Z1231 Encounter for screening mammogram for malignant neoplasm of breast: Secondary | ICD-10-CM
